# Patient Record
Sex: Male | Born: 1950 | Race: White | Hispanic: No | Smoking: Current every day smoker
Health system: Southern US, Community
[De-identification: ages and names within clinical notes are randomized; demographics above are authoritative.]

## PROBLEM LIST (undated history)

## (undated) DIAGNOSIS — J45909 Unspecified asthma, uncomplicated: Secondary | ICD-10-CM

## (undated) DIAGNOSIS — J449 Chronic obstructive pulmonary disease, unspecified: Secondary | ICD-10-CM

## (undated) HISTORY — PX: JOINT REPLACEMENT: SHX530

---

## 1997-11-03 ENCOUNTER — Inpatient Hospital Stay (HOSPITAL_COMMUNITY): Admission: EM | Admit: 1997-11-03 | Discharge: 1997-11-04 | Payer: Self-pay | Admitting: Emergency Medicine

## 1997-11-11 ENCOUNTER — Ambulatory Visit (HOSPITAL_COMMUNITY): Admission: RE | Admit: 1997-11-11 | Discharge: 1997-11-11 | Payer: Self-pay | Admitting: *Deleted

## 1998-01-06 ENCOUNTER — Encounter: Admission: RE | Admit: 1998-01-06 | Discharge: 1998-01-06 | Payer: Self-pay | Admitting: Internal Medicine

## 1998-02-20 ENCOUNTER — Inpatient Hospital Stay (HOSPITAL_COMMUNITY): Admission: EM | Admit: 1998-02-20 | Discharge: 1998-02-22 | Payer: Self-pay | Admitting: Emergency Medicine

## 2000-07-30 ENCOUNTER — Emergency Department (HOSPITAL_COMMUNITY): Admission: EM | Admit: 2000-07-30 | Discharge: 2000-07-30 | Payer: Self-pay | Admitting: Emergency Medicine

## 2000-07-30 ENCOUNTER — Encounter: Payer: Self-pay | Admitting: Emergency Medicine

## 2001-03-15 ENCOUNTER — Encounter: Payer: Self-pay | Admitting: Emergency Medicine

## 2001-03-15 ENCOUNTER — Inpatient Hospital Stay (HOSPITAL_COMMUNITY): Admission: EM | Admit: 2001-03-15 | Discharge: 2001-03-26 | Payer: Self-pay | Admitting: Emergency Medicine

## 2001-03-16 ENCOUNTER — Encounter: Payer: Self-pay | Admitting: General Surgery

## 2001-03-17 ENCOUNTER — Encounter: Payer: Self-pay | Admitting: General Surgery

## 2001-05-02 ENCOUNTER — Encounter: Admission: RE | Admit: 2001-05-02 | Discharge: 2001-05-09 | Payer: Self-pay | Admitting: Physician Assistant

## 2005-07-25 ENCOUNTER — Emergency Department (HOSPITAL_COMMUNITY): Admission: EM | Admit: 2005-07-25 | Discharge: 2005-07-25 | Payer: Self-pay | Admitting: Emergency Medicine

## 2017-01-09 ENCOUNTER — Observation Stay (HOSPITAL_COMMUNITY): Payer: Medicare Other

## 2017-01-09 ENCOUNTER — Inpatient Hospital Stay (HOSPITAL_COMMUNITY)
Admission: EM | Admit: 2017-01-09 | Discharge: 2017-01-12 | DRG: 146 | Discharge status: Against Medical Advice | Payer: Medicare Other | Attending: Oncology | Admitting: Oncology

## 2017-01-09 ENCOUNTER — Emergency Department (HOSPITAL_COMMUNITY): Payer: Medicare Other

## 2017-01-09 ENCOUNTER — Inpatient Hospital Stay (HOSPITAL_COMMUNITY): Payer: Medicare Other

## 2017-01-09 ENCOUNTER — Encounter (HOSPITAL_COMMUNITY): Payer: Self-pay | Admitting: Neurology

## 2017-01-09 DIAGNOSIS — B370 Candidal stomatitis: Secondary | ICD-10-CM | POA: Diagnosis present

## 2017-01-09 DIAGNOSIS — F17210 Nicotine dependence, cigarettes, uncomplicated: Secondary | ICD-10-CM | POA: Diagnosis present

## 2017-01-09 DIAGNOSIS — C140 Malignant neoplasm of pharynx, unspecified: Secondary | ICD-10-CM | POA: Diagnosis present

## 2017-01-09 DIAGNOSIS — W1830XA Fall on same level, unspecified, initial encounter: Secondary | ICD-10-CM | POA: Diagnosis present

## 2017-01-09 DIAGNOSIS — E861 Hypovolemia: Secondary | ICD-10-CM | POA: Diagnosis present

## 2017-01-09 DIAGNOSIS — E46 Unspecified protein-calorie malnutrition: Secondary | ICD-10-CM | POA: Diagnosis not present

## 2017-01-09 DIAGNOSIS — Z955 Presence of coronary angioplasty implant and graft: Secondary | ICD-10-CM

## 2017-01-09 DIAGNOSIS — R0600 Dyspnea, unspecified: Secondary | ICD-10-CM

## 2017-01-09 DIAGNOSIS — I889 Nonspecific lymphadenitis, unspecified: Secondary | ICD-10-CM | POA: Diagnosis not present

## 2017-01-09 DIAGNOSIS — R1312 Dysphagia, oropharyngeal phase: Secondary | ICD-10-CM | POA: Diagnosis present

## 2017-01-09 DIAGNOSIS — T17998A Other foreign object in respiratory tract, part unspecified causing other injury, initial encounter: Secondary | ICD-10-CM | POA: Diagnosis present

## 2017-01-09 DIAGNOSIS — Z5321 Procedure and treatment not carried out due to patient leaving prior to being seen by health care provider: Secondary | ICD-10-CM

## 2017-01-09 DIAGNOSIS — F1010 Alcohol abuse, uncomplicated: Secondary | ICD-10-CM | POA: Diagnosis present

## 2017-01-09 DIAGNOSIS — J449 Chronic obstructive pulmonary disease, unspecified: Secondary | ICD-10-CM

## 2017-01-09 DIAGNOSIS — C7800 Secondary malignant neoplasm of unspecified lung: Secondary | ICD-10-CM | POA: Diagnosis present

## 2017-01-09 DIAGNOSIS — N390 Urinary tract infection, site not specified: Secondary | ICD-10-CM | POA: Diagnosis present

## 2017-01-09 DIAGNOSIS — Z7982 Long term (current) use of aspirin: Secondary | ICD-10-CM

## 2017-01-09 DIAGNOSIS — S50812A Abrasion of left forearm, initial encounter: Secondary | ICD-10-CM | POA: Diagnosis present

## 2017-01-09 DIAGNOSIS — Z79899 Other long term (current) drug therapy: Secondary | ICD-10-CM

## 2017-01-09 DIAGNOSIS — Z8674 Personal history of sudden cardiac arrest: Secondary | ICD-10-CM

## 2017-01-09 DIAGNOSIS — Z9889 Other specified postprocedural states: Secondary | ICD-10-CM | POA: Diagnosis not present

## 2017-01-09 DIAGNOSIS — Z6821 Body mass index (BMI) 21.0-21.9, adult: Secondary | ICD-10-CM

## 2017-01-09 DIAGNOSIS — R55 Syncope and collapse: Secondary | ICD-10-CM | POA: Diagnosis present

## 2017-01-09 DIAGNOSIS — N179 Acute kidney failure, unspecified: Secondary | ICD-10-CM

## 2017-01-09 DIAGNOSIS — J392 Other diseases of pharynx: Secondary | ICD-10-CM | POA: Insufficient documentation

## 2017-01-09 DIAGNOSIS — I10 Essential (primary) hypertension: Secondary | ICD-10-CM | POA: Diagnosis not present

## 2017-01-09 DIAGNOSIS — E876 Hypokalemia: Secondary | ICD-10-CM | POA: Diagnosis present

## 2017-01-09 DIAGNOSIS — E43 Unspecified severe protein-calorie malnutrition: Secondary | ICD-10-CM | POA: Diagnosis present

## 2017-01-09 DIAGNOSIS — Z7289 Other problems related to lifestyle: Secondary | ICD-10-CM | POA: Diagnosis not present

## 2017-01-09 DIAGNOSIS — I2510 Atherosclerotic heart disease of native coronary artery without angina pectoris: Secondary | ICD-10-CM | POA: Diagnosis present

## 2017-01-09 DIAGNOSIS — I252 Old myocardial infarction: Secondary | ICD-10-CM

## 2017-01-09 DIAGNOSIS — E860 Dehydration: Secondary | ICD-10-CM | POA: Diagnosis present

## 2017-01-09 DIAGNOSIS — I959 Hypotension, unspecified: Secondary | ICD-10-CM | POA: Diagnosis present

## 2017-01-09 DIAGNOSIS — J387 Other diseases of larynx: Secondary | ICD-10-CM | POA: Diagnosis not present

## 2017-01-09 DIAGNOSIS — R1310 Dysphagia, unspecified: Secondary | ICD-10-CM

## 2017-01-09 DIAGNOSIS — C779 Secondary and unspecified malignant neoplasm of lymph node, unspecified: Secondary | ICD-10-CM | POA: Diagnosis present

## 2017-01-09 DIAGNOSIS — R8299 Other abnormal findings in urine: Secondary | ICD-10-CM | POA: Diagnosis not present

## 2017-01-09 DIAGNOSIS — Z8249 Family history of ischemic heart disease and other diseases of the circulatory system: Secondary | ICD-10-CM

## 2017-01-09 DIAGNOSIS — I951 Orthostatic hypotension: Secondary | ICD-10-CM | POA: Diagnosis not present

## 2017-01-09 HISTORY — DX: Chronic obstructive pulmonary disease, unspecified: J44.9

## 2017-01-09 HISTORY — DX: Unspecified asthma, uncomplicated: J45.909

## 2017-01-09 LAB — COMPREHENSIVE METABOLIC PANEL
ALT: 10 U/L — AB (ref 17–63)
AST: 17 U/L (ref 15–41)
Albumin: 2.9 g/dL — ABNORMAL LOW (ref 3.5–5.0)
Alkaline Phosphatase: 57 U/L (ref 38–126)
Anion gap: 14 (ref 5–15)
BUN: 34 mg/dL — ABNORMAL HIGH (ref 6–20)
CHLORIDE: 101 mmol/L (ref 101–111)
CO2: 21 mmol/L — AB (ref 22–32)
CREATININE: 1.76 mg/dL — AB (ref 0.61–1.24)
Calcium: 9.1 mg/dL (ref 8.9–10.3)
GFR calc non Af Amer: 39 mL/min — ABNORMAL LOW (ref >60)
GFR, EST AFRICAN AMERICAN: 45 mL/min — AB (ref >60)
Glucose, Bld: 98 mg/dL (ref 65–99)
Potassium: 3 mmol/L — ABNORMAL LOW (ref 3.5–5.1)
SODIUM: 136 mmol/L (ref 135–145)
Total Bilirubin: 1.1 mg/dL (ref 0.3–1.2)
Total Protein: 5.9 g/dL — ABNORMAL LOW (ref 6.5–8.1)

## 2017-01-09 LAB — MAGNESIUM: MAGNESIUM: 1.9 mg/dL (ref 1.7–2.4)

## 2017-01-09 LAB — CBC WITH DIFFERENTIAL/PLATELET
BASOS ABS: 0 10*3/uL (ref 0.0–0.1)
Basophils Relative: 0 %
EOS ABS: 0 10*3/uL (ref 0.0–0.7)
Eosinophils Relative: 1 %
HCT: 40.4 % (ref 39.0–52.0)
HEMOGLOBIN: 13.7 g/dL (ref 13.0–17.0)
LYMPHS ABS: 0.7 10*3/uL (ref 0.7–4.0)
Lymphocytes Relative: 9 %
MCH: 29.1 pg (ref 26.0–34.0)
MCHC: 33.9 g/dL (ref 30.0–36.0)
MCV: 85.8 fL (ref 78.0–100.0)
Monocytes Absolute: 0.5 10*3/uL (ref 0.1–1.0)
Monocytes Relative: 6 %
NEUTROS PCT: 84 %
Neutro Abs: 6.4 10*3/uL (ref 1.7–7.7)
Platelets: 153 10*3/uL (ref 150–400)
RBC: 4.71 MIL/uL (ref 4.22–5.81)
RDW: 15.9 % — ABNORMAL HIGH (ref 11.5–15.5)
WBC: 7.6 10*3/uL (ref 4.0–10.5)

## 2017-01-09 LAB — TROPONIN I

## 2017-01-09 LAB — I-STAT TROPONIN, ED: TROPONIN I, POC: 0 ng/mL (ref 0.00–0.08)

## 2017-01-09 LAB — ETHANOL: Alcohol, Ethyl (B): <5 mg/dL (ref <5)

## 2017-01-09 LAB — URINALYSIS, ROUTINE W REFLEX MICROSCOPIC
Bilirubin Urine: NEGATIVE
Glucose, UA: NEGATIVE mg/dL
Ketones, ur: 20 mg/dL — AB
Nitrite: NEGATIVE
PH: 5 (ref 5.0–8.0)
Protein, ur: NEGATIVE mg/dL
SPECIFIC GRAVITY, URINE: 1.024 (ref 1.005–1.030)

## 2017-01-09 LAB — I-STAT CHEM 8, ED
BUN: 36 mg/dL — ABNORMAL HIGH (ref 6–20)
CREATININE: 1.4 mg/dL — AB (ref 0.61–1.24)
Calcium, Ion: 1.09 mmol/L — ABNORMAL LOW (ref 1.15–1.40)
Chloride: 101 mmol/L (ref 101–111)
Glucose, Bld: 93 mg/dL (ref 65–99)
HEMATOCRIT: 40 % (ref 39.0–52.0)
HEMOGLOBIN: 13.6 g/dL (ref 13.0–17.0)
POTASSIUM: 3.1 mmol/L — AB (ref 3.5–5.1)
SODIUM: 137 mmol/L (ref 135–145)
TCO2: 24 mmol/L (ref 0–100)

## 2017-01-09 LAB — RAPID URINE DRUG SCREEN, HOSP PERFORMED
Amphetamines: NOT DETECTED
BARBITURATES: NOT DETECTED
BENZODIAZEPINES: NOT DETECTED
COCAINE: NOT DETECTED
OPIATES: NOT DETECTED
TETRAHYDROCANNABINOL: NOT DETECTED

## 2017-01-09 LAB — CBG MONITORING, ED: Glucose-Capillary: 86 mg/dL (ref 65–99)

## 2017-01-09 LAB — PROTIME-INR
INR: 1.19
PROTHROMBIN TIME: 15.2 s (ref 11.4–15.2)

## 2017-01-09 LAB — I-STAT VENOUS BLOOD GAS, ED
Acid-Base Excess: 3 mmol/L — ABNORMAL HIGH (ref 0.0–2.0)
BICARBONATE: 25.7 mmol/L (ref 20.0–28.0)
O2 Saturation: 27 %
PCO2 VEN: 33.9 mmHg — AB (ref 44.0–60.0)
TCO2: 27 mmol/L (ref 0–100)
pH, Ven: 7.489 — ABNORMAL HIGH (ref 7.250–7.430)
pO2, Ven: 16 mmHg — CL (ref 32.0–45.0)

## 2017-01-09 LAB — I-STAT CG4 LACTIC ACID, ED: LACTIC ACID, VENOUS: 1.97 mmol/L — AB (ref 0.5–1.9)

## 2017-01-09 LAB — CK: Total CK: 35 U/L — ABNORMAL LOW (ref 49–397)

## 2017-01-09 LAB — SALICYLATE LEVEL: SALICYLATE LVL: 14.9 mg/dL (ref 2.8–30.0)

## 2017-01-09 LAB — ACETAMINOPHEN LEVEL: Acetaminophen (Tylenol), Serum: <10 ug/mL — ABNORMAL LOW (ref 10–30)

## 2017-01-09 MED ORDER — POTASSIUM CHLORIDE CRYS ER 20 MEQ PO TBCR
40.0000 meq | EXTENDED_RELEASE_TABLET | Freq: Once | ORAL | Status: AC
  Administered 2017-01-09: 40 meq via ORAL
  Filled 2017-01-09: qty 2

## 2017-01-09 MED ORDER — ALBUTEROL SULFATE (2.5 MG/3ML) 0.083% IN NEBU: 2.5000 mg | INHALATION_SOLUTION | RESPIRATORY_TRACT | Status: DC | PRN

## 2017-01-09 MED ORDER — POTASSIUM CHLORIDE IN NACL 40-0.9 MEQ/L-% IV SOLN: INTRAVENOUS | Status: DC

## 2017-01-09 MED ORDER — SODIUM CHLORIDE 0.9 % IV BOLUS (SEPSIS)
1000.0000 mL | Freq: Once | INTRAVENOUS | Status: AC
  Administered 2017-01-09: 1000 mL via INTRAVENOUS

## 2017-01-09 MED ORDER — ADULT MULTIVITAMIN W/MINERALS CH: 1.0000 | ORAL_TABLET | Freq: Every day | ORAL | Status: DC

## 2017-01-09 MED ORDER — THIAMINE HCL 100 MG/ML IJ SOLN
100.0000 mg | Freq: Every day | INTRAMUSCULAR | Status: DC
  Administered 2017-01-10 – 2017-01-12 (×2): 100 mg via INTRAVENOUS
  Filled 2017-01-09 (×2): qty 2

## 2017-01-09 MED ORDER — DEXTROSE 5 % IV SOLN
1.0000 g | Freq: Once | INTRAVENOUS | Status: AC
  Administered 2017-01-09: 1 g via INTRAVENOUS
  Filled 2017-01-09: qty 10

## 2017-01-09 MED ORDER — HYDROMORPHONE HCL 1 MG/ML IJ SOLN: 1.0000 mg | Freq: Once | INTRAMUSCULAR | Status: DC

## 2017-01-09 MED ORDER — ENOXAPARIN SODIUM 40 MG/0.4ML ~~LOC~~ SOLN
40.0000 mg | SUBCUTANEOUS | Status: DC
  Administered 2017-01-10 – 2017-01-12 (×2): 40 mg via SUBCUTANEOUS
  Filled 2017-01-09 (×2): qty 0.4

## 2017-01-09 MED ORDER — ASPIRIN EC 81 MG PO TBEC: 81.0000 mg | DELAYED_RELEASE_TABLET | Freq: Every day | ORAL | Status: DC

## 2017-01-09 MED ORDER — FOLIC ACID 1 MG PO TABS: 1.0000 mg | ORAL_TABLET | Freq: Every day | ORAL | Status: DC

## 2017-01-09 MED ORDER — IPRATROPIUM-ALBUTEROL 0.5-2.5 (3) MG/3ML IN SOLN: 3.0000 mL | Freq: Four times a day (QID) | RESPIRATORY_TRACT | Status: DC | PRN

## 2017-01-09 MED ORDER — NYSTATIN 100000 UNIT/ML MT SUSP: 5.0000 mL | Freq: Four times a day (QID) | OROMUCOSAL | Status: DC

## 2017-01-09 MED ORDER — LORAZEPAM 2 MG/ML IJ SOLN
1.0000 mg | Freq: Four times a day (QID) | INTRAMUSCULAR | Status: DC | PRN
  Administered 2017-01-10 (×2): 1 mg via INTRAVENOUS
  Filled 2017-01-09 (×2): qty 1

## 2017-01-09 MED ORDER — FLUCONAZOLE IN SODIUM CHLORIDE 100-0.9 MG/50ML-% IV SOLN
100.0000 mg | INTRAVENOUS | Status: DC
  Administered 2017-01-09 – 2017-01-10 (×2): 100 mg via INTRAVENOUS
  Filled 2017-01-09 (×2): qty 50

## 2017-01-09 MED ORDER — POTASSIUM CHLORIDE 20 MEQ/15ML (10%) PO SOLN
20.0000 meq | Freq: Once | ORAL | Status: AC
  Administered 2017-01-09: 20 meq via ORAL
  Filled 2017-01-09: qty 15

## 2017-01-09 MED ORDER — IPRATROPIUM-ALBUTEROL 0.5-2.5 (3) MG/3ML IN SOLN
3.0000 mL | Freq: Once | RESPIRATORY_TRACT | Status: AC
  Administered 2017-01-09: 3 mL via RESPIRATORY_TRACT
  Filled 2017-01-09: qty 3

## 2017-01-09 MED ORDER — SODIUM CHLORIDE 0.9% FLUSH
3.0000 mL | Freq: Two times a day (BID) | INTRAVENOUS | Status: DC
  Administered 2017-01-10: 3 mL via INTRAVENOUS

## 2017-01-09 MED ORDER — ATORVASTATIN CALCIUM 40 MG PO TABS: 40.0000 mg | ORAL_TABLET | Freq: Every day | ORAL | Status: DC

## 2017-01-09 MED ORDER — SODIUM CHLORIDE 0.9 % IV BOLUS (SEPSIS)
1000.0000 mL | Freq: Once | INTRAVENOUS | Status: AC
Start: 2017-01-09 — End: 2017-01-09
  Administered 2017-01-09: 1000 mL via INTRAVENOUS

## 2017-01-09 MED ORDER — LORAZEPAM 1 MG PO TABS: 1.0000 mg | ORAL_TABLET | Freq: Four times a day (QID) | ORAL | Status: DC | PRN

## 2017-01-09 MED ORDER — POTASSIUM CHLORIDE IN NACL 40-0.9 MEQ/L-% IV SOLN
INTRAVENOUS | Status: DC
  Administered 2017-01-09: 125 mL/h via INTRAVENOUS
  Filled 2017-01-09: qty 1000

## 2017-01-09 MED ORDER — VITAMIN B-1 100 MG PO TABS: 100.0000 mg | ORAL_TABLET | Freq: Every day | ORAL | Status: DC

## 2017-01-09 NOTE — ED Notes (Signed)
Spoke with Dr Posey Pronto re: pt BP per Dr Henderson Cloud to go to step down awaiting orders for additional fluid bolus and maintenance fluids.

## 2017-01-09 NOTE — ED Notes (Signed)
IM aware of low bp.

## 2017-01-09 NOTE — ED Notes (Signed)
Attempted report x1 to SDU

## 2017-01-09 NOTE — ED Triage Notes (Signed)
Per ems- Pt comes from home. C/o generalized weakness, hypotension since today. Reports syncopal episode today causing him to fall. Skin tear to left forearm from ground level fall. C/o h/a before fall, 10/10. Initial BP 83/31, given 1 l NS, 111/80 recent BP. 96% RA. Generalized weakness, equal grips. CBG 117. History: asthma, COPD.

## 2017-01-09 NOTE — ED Notes (Signed)
Attempted report x1. 

## 2017-01-09 NOTE — ED Notes (Signed)
Patient transported to X-ray 

## 2017-01-09 NOTE — ED Notes (Addendum)
Patient in acute respiratory distress. Sitting in tripod position at the end of the bed. Patient coughing up sputum. Patient states he cannot breath but able to talk in short sentences. EDP at bedside. Patient placed on NR and given suction. Patient improved. cxray ordered and douneb ordered. IM MD updated and MD to come to see patient.

## 2017-01-09 NOTE — Progress Notes (Signed)
Pt sleeping when RT arrived to teach use of Flutter valve. No obvious respiratory distress noted at this time. SpO2 100% on RA. Flutter left at bedside. RT Will check back later.

## 2017-01-09 NOTE — ED Notes (Signed)
Manual bp 74/48. MD aware. Patient sleeping and has no c/o.

## 2017-01-09 NOTE — ED Notes (Signed)
Patient attempted to swallow second K+ pill. This RN saw patient began to choke on K+ pill as he was in distress. This RN asked patient, "are you choking?" Patient nodded. Heimlich Maneuver done x4, patient still gasping for air, Dr. Regenia Skeeter arrived and performed heimlich maneuver x2. K+ pill was dislodged from throat. MD ordered oral K+ solution. Patient stating he is okay.

## 2017-01-09 NOTE — H&P (Signed)
Date: 01/09/2017               Patient Name:  Jermaine Scott MRN: [id number]  DOB: 06/21/1951 Age / Sex: 66 y.o., male   PCP: Patient, No Pcp Per              Medical Service: Internal Medicine Teaching Service              Attending Physician: Dr. Beryle Beams, Alyson Locket, MD    First Contact: Marylen Ponto, MS4 Pager: [phone]  Second Contact: Dr. Posey Pronto Pager: [phone]            After Hours (After 5p/  First Contact Pager: [phone]  weekends / holidays): Second Contact Pager: [phone]   Chief Complaint: syncope  History of Present Illness:  Jermaine Scott is a 66 yo man with history of COPD, HTN, CAD s/p PCI who presents after a near-syncopal episode this morning.  He states that he woke up this morning with a mild headache.  Around 30 minutes after waking up, he went to sit at the kitchen table to set up his plate but had not yet had anything to eat or drink that morning.  He then stood up and started to walk down the hallway and started to feel dizzy and fell to the ground without LOC.  He does not recall any preceding symptoms including CP, palpitations, increasing HA, vision changes / tunnel vision.  He did not have any prolonged weakness or loss of bladder or bowel incontinence after the fall.  He does state that he does not think he has been drinking as well as he should be given the heat, but thinks he had a good meal and water before going to bed.  Additionally, he has not had any more generalized fever, fatigue, weakness, dysuria, or increased urinary frequency.    He does have an additional complaint of 3-4 days of sore throat.  In the end of May, he had a similar presentation at [facility].  At that time, he was treated for dehydration with pre-renal AKI and COPD exacerbation.  He does not take any medications at this time other than aspirin.  He says he drinks around 12 beers a week and says he does not have a history of alcohol withdrawal.    He was given fluid with EMS prior to arrival in  the ED with resolution of some of his dizziness and HA.  On rechek in the ED, his bp was ~60/40 and he was given a 2L bolus with improvement of pressures.  POCUS was negative for pericardial effusion and AAA. CT head was negative and he was given one dose of ceftriaxone for possible UTI.  He choked on his potassium supplement in the ED and required heimlich maneuver.   After initial history, he had an episode of hypotension and SOB.  He was coughing up clear sputum and increased HA.  He was given duoneb treatment with some improvement.  The was also given another liter of NS at this time with improvement in bp.    Meds: Current Meds  Medication Sig  . aspirin EC 325 MG tablet Take 325 mg by mouth every morning.  Marland Kitchen atorvastatin (LIPITOR) 40 MG tablet Take 40 mg by mouth at bedtime.     Allergies: Allergies as of 01/09/2017  . (No Known Allergies)   Past Medical History:  Diagnosis Date  . Asthma   . COPD (chronic obstructive pulmonary disease) ([place])     Family History:  Significant only for MI in father at ~66.  Social History:  He lives by himself and likes to watch TV and likes to walk in the woods where he collects materials to make crafts from.    He says he has been smoking 1ppd for ~50 years. He also uses around 12 beers / week, but does not have any history of withdrawal. He denies any illicit or non-prescription drug use.    Review of Systems: Constitutional: Denies fever, fatigue, chills, night sweats, unintentional weight change HEENT: No sinus pain. No change in vision or hearing.  CV:  Denies chest pain, palpitations, orthopnea Resp: No cough, shortness of breath GI: Denies abdominal pain, nausea, vomiting, diarrhea.  No constipation, blood in stool, dysphagia MSK: Denies arthralgias, myalgias, joint pain Urology: Denies dysuria, hematuria, change in urinary frequency, change in urinary urgency Skin: Denies new rash, new worrisome lesions Neurology: Denies  headache, numbness Psychology:  Denies depressed mood, agitation, sleep problems   Physical Exam: Blood pressure (!) 79/60, pulse 65, temperature 97.3 F (36.3 C), temperature source Rectal, resp. rate (!) 24, height 5\' 10"  (1.778 m), weight 68 kg (150 lb), SpO2 99 %. Gen: Malnourished man who appears older than stated age in NAD. Neuro:  Alert and oriented x3. Strength 5/5 bilaterally in upper and lower extremities.  DTRs 2+.   HEENT:  Head: NCAT.  Eyes: EOMI. PERRLA.  Mouth:  White plaques covering tongue and posterior pharynx.  Moist mucous membranes.  CV: RRR with no murmurs, rubs, or gallops. Pulm:  Diffuse expiratory wheezes and vibrations.  Back:  Firm, mobile mass on left upper back. No CVA tenderness.  Abdomen: Soft, non-tender to palpation in four quadrants.  Normal BS appreciated.  Extremities:  No LE edema.  Pulses in all extremities intact. Skin: Ecchymosis on upper extremities. No hyperpigmentation.    Assessment & Plan by Problem: Principal Problem:   Syncope Active Problems:   COPD (chronic obstructive pulmonary disease) (HCC)   Hypotension   AKI (acute kidney injury) ([place])   Oral candidiasis  Hypotension and Near syncope: Presentation likely secondary to dehydration or orthostatic hypotension given poor input per patient and similar presentation in around 1 month ago.  This seems to be fluid responsive and symptoms of headache and dizziness improved with IVF.  He is s/p 3L in the ED.  However, we would expect more of a sustained improvement after fluid boluses.  Other etiologies of syncope including neurogenic are less likely given history without post-ictal period or incontinence and blood glucose in the ED was normal.  He also does not give [place] a clear cardiac history without CP or palpitations, but would entertain this as an etiology given no prodrome to symptoms and recurrent hypotension.  We have additionally considered AI related to his hypotension, but he does  not have generalized weakness / fatigue, electrolyte abnormalities, and recent morning cortisol in May wnl.   - admit to stepdown  -  telemetry monitoring - nutrition consult given appears malnourished - ECHO - orthostatic bps - PT / OT evals  Dyspnea: Likely related to his underlying COPD. However, he is not sure if duoneb improved his symptoms.  He does not take any medications at this time for COPD, but does have a diagnosis in his chart per care everywhere with PRN albuterol prescription.  He was treated for a COPD exacerbation ~1 month ago at [facility] with a similar presentation.  He has gotten 3L IVF with hypotension so pulmonary edema was considered but  CXR only shows hyperinflation. There is a also concern for aspiration given episode in the ED and new onset SOB.  - NPO possible given aspiration risk - duoneb q6 PRN for shortness of breath - flutter valve PRN   AKI:   Likely pre-renal given presentation with hypotension.  Serum Cr today of 1.76 with baseline around 0.8 in 2017.  BUN/Cr of ~20. Should improve now that he is s/p 3L NS.   - IVF at 125 cc/hr - will recheck in BMP AM  Alcohol use disorder: Uses around 12 beers a week and does not have a history of withdrawal.  Last drink yesterday evening and alcohol level undetectable in the ED today.   - CIWA protocol  - thiamine and folate supplementation  Oral candidiasis: Gives a 3-4 day history of sore throat with thrush on exam.  - IV fluconazole 100 mg  - given some fatigue picture with possible esophageal candidiasis, will check HIV  Small leukocytes on UA: Will not continue treatment at this time as he is asymptomatic.  S/p one dose of ceftriaxone in the ED.   FEN: IVF at 125 ml/ hr ; Replete electrolytes as needed VTE PPX: enoxaparin CODE:  Full  Signed: Doug Sou, Medical Student 01/09/2017, 7:48 PM   Attestation for Student Documentation:  I personally was present and performed or re-performed the history,  physical exam and medical decision-making activities of this service and have verified that the service and findings are accurately documented in the student's note.  Zada Finders, MD 01/09/2017, 8:07 PM

## 2017-01-09 NOTE — ED Provider Notes (Signed)
[facility] DEPT Provider Note   CSN: [id number] Arrival date & time: 01/09/17  1324     History   Chief Complaint Chief Complaint  Patient presents with  . Weakness    HPI Jermaine Scott is a 66 y.o. male.  HPI  8 rolled male presents with dizziness, syncope, and headache. He states it's a frontal headache although it has been bitemporal at times as well. The patient states that the dizziness has been worse today but started at least yesterday. He states he feels like he is about to pass out/lightheadedness. Today while he was walking the dizziness got worse and he did pass out. No chest pain or shortness of breath. He states he is always a little short of breath from COPD but it has not worsened. He states he drinks 1 glass of wine or a couple beers per night but then states he does not drink every day. No abdominal pain, nausea, vomiting, diarrhea. No urinary symptoms. He states the headache has been gradual in onset and did not worsen before or after the passing out. He did suffer a small abrasion to his left forearm. He was given IV fluids by EMS due to hypotension and states his dizziness was a little bit better after that.  Past Medical History:  Diagnosis Date  . Asthma   . COPD (chronic obstructive pulmonary disease) (HCC)     There are no active problems to display for this patient.   Past Surgical History:  Procedure Laterality Date  . JOINT REPLACEMENT         Home Medications    Prior to Admission medications   Not on File    Family History No family history on file.  Social History Social History  Substance Use Topics  . Smoking status: Current Every Day Smoker  . Smokeless tobacco: Not on file  . Alcohol use Yes     Allergies   Patient has no known allergies.   Review of Systems Review of Systems  Constitutional: Negative for fever.  Respiratory: Negative for cough and shortness of breath.   Cardiovascular: Negative for chest pain.    Gastrointestinal: Negative for abdominal pain and vomiting.  Neurological: Positive for dizziness, syncope, weakness (generalized) and headaches.  All other systems reviewed and are negative.    Physical Exam Updated Vital Signs BP (!) 81/63   Pulse (!) 55   Temp 97.3 F (36.3 C) (Rectal)   Resp 18   Ht 5\' 10"  (1.778 m)   Wt 68 kg (150 lb)   SpO2 100%   BMI 21.52 kg/m   Physical Exam  Constitutional: He is oriented to person, place, and time. He appears well-developed and well-nourished. No distress.  HENT:  Head: Normocephalic and atraumatic.  Right Ear: External ear normal.  Left Ear: External ear normal.  Nose: Nose normal.  Eyes: EOM are normal. Pupils are equal, round, and reactive to light. Right eye exhibits no discharge. Left eye exhibits no discharge.  Neck: Neck supple.  Cardiovascular: Regular rhythm and normal heart sounds.  Bradycardia present.   Pulses:      Radial pulses are 1+ on the right side, and 1+ on the left side.  Pulmonary/Chest: Effort normal and breath sounds normal.  Abdominal: Soft. He exhibits no distension. There is no tenderness.  Musculoskeletal: He exhibits no edema.  Small skin tear to left forearm  Neurological: He is alert and oriented to person, place, and time.  CN 3-12 grossly intact. 5/5 strength in  all 4 extremities. Grossly normal sensation.  Skin: Skin is warm and dry. He is not diaphoretic.  Nursing note and vitals reviewed.    ED Treatments / Results  Labs (all labs ordered are listed, but only abnormal results are displayed) Labs Reviewed  CBC WITH DIFFERENTIAL/PLATELET - Abnormal; Notable for the following:       Result Value   RDW 15.9 (*)    All other components within normal limits  I-STAT CHEM 8, ED - Abnormal; Notable for the following:    Potassium 3.1 (*)    BUN 36 (*)    Creatinine, Ser 1.40 (*)    Calcium, Ion 1.09 (*)    All other components within normal limits  I-STAT CG4 LACTIC ACID, ED - Abnormal;  Notable for the following:    Lactic Acid, Venous 1.97 (*)    All other components within normal limits  I-STAT VENOUS BLOOD GAS, ED - Abnormal; Notable for the following:    pH, Ven 7.489 (*)    pCO2, Ven 33.9 (*)    pO2, Ven 16.0 (*)    Acid-Base Excess 3.0 (*)    All other components within normal limits  PROTIME-INR  COMPREHENSIVE METABOLIC PANEL  ETHANOL  TROPONIN I  ACETAMINOPHEN LEVEL  SALICYLATE LEVEL  URINALYSIS, ROUTINE W REFLEX MICROSCOPIC  RAPID URINE DRUG SCREEN, HOSP PERFORMED  I-STAT TROPOININ, ED  CBG MONITORING, ED    EKG  EKG Interpretation  Date/Time:  Tuesday January 09 2017 13:55:21 EDT Ventricular Rate:  45 PR Interval:    QRS Duration: 98 QT Interval:  452 QTC Calculation: 391 R Axis:   -92 Text Interpretation:  Sinus bradycardia Left anterior fascicular block Probable anteroseptal infarct, old Nonspecific T abnormalities, lateral leads Interpretation limited secondary to artifact Confirmed by Sherwood Gambler [phone]) on 01/09/2017 2:27:10 PM       Radiology Dg Chest Portable 1 View  Result Date: 01/09/2017 CLINICAL DATA:  Low blood pressure, weakness, smoking history EXAM: PORTABLE CHEST 1 VIEW COMPARISON:  Chest x-ray of 12/11/2016 FINDINGS: The lungs are clear but hyperaerated. Mediastinal and hilar contours are stable and unremarkable. The heart is within normal limits in size. Nipple shadows remain at the lung bases. No bony abnormality is seen with an old healed right midclavicular fracture again noted. IMPRESSION: Hyperaeration.  No active lung disease. Electronically Signed   By: Ivar Drape M.D.   On: 01/09/2017 14:13    Procedures Procedures (including critical care time) CRITICAL CARE Performed by: Sherwood Gambler T   Total critical care time: 35 minutes  Critical care time was exclusive of separately billable procedures and treating other patients.  Critical care was necessary to treat or prevent imminent or life-threatening  deterioration.  Critical care was time spent personally by me on the following activities: development of treatment plan with patient and/or surrogate as well as nursing, discussions with consultants, evaluation of patient's response to treatment, examination of patient, obtaining history from patient or surrogate, ordering and performing treatments and interventions, ordering and review of laboratory studies, ordering and review of radiographic studies, pulse oximetry and re-evaluation of patient's condition.   Heimlich Maneuver Procedure Note: Procedure was emergently performed due to acute and severe nature of his choking Hands were placed over epigastrum from behind patient and procedure was performed multiple times, eventually resulting in removal of the potassium pill that caused choking Patient had complete recover, no obvious complications noted. Medications Ordered in ED Medications  sodium chloride 0.9 % bolus 1,000 mL (1,000 mLs  Intravenous New Bag/Given 01/09/17 1357)  sodium chloride 0.9 % bolus 1,000 mL (1,000 mLs Intravenous New Bag/Given 01/09/17 1357)     Initial Impression / Assessment and Plan / ED Course  I have reviewed the triage vital signs and the nursing notes.  Pertinent labs & imaging results that were available during my care of the patient were reviewed by me and considered in my medical decision making (see chart for details).     On chart review, patient had a very similar episode where he was admitted to an outside hospital at the end of May. Had similar dizziness, acute renal failure, and hypotension. He also had a headache at that time. Given what seems to be chronic alcohol abuse and a syncopal episode, CT head was obtained and is negative. He denies urinary symptoms well with small leukocytes in the setting of significant hypotension, he will be treated for a UTI and urine sent for culture. His baseline creatinine appears to be 1 or less according to outside  records. His blood pressure has improved. On arrival I did use a bedside ultrasound and could not find an obvious source of hypotension such as pericardial effusion or AAA. He also has no localizing findings. No signs of a significant infection on review of systems.   While in the ED he was having his potassium replaced orally and started choking on one of the potassium pills. The Heimlich maneuver was performed by myself and this pill was removed. Later he seemed to have trouble with the liquid form and so he probably needs a speech evaluation as well.  Final Clinical Impressions(s) / ED Diagnoses   Final diagnoses:  AKI (acute kidney injury) ([place])  Hypotension, unspecified hypotension type    New Prescriptions New Prescriptions   No medications on file     Sherwood Gambler, MD 01/09/17 1644

## 2017-01-09 NOTE — ED Provider Notes (Signed)
Blood pressure 110/77, pulse 62, temperature 97.3 F (36.3 C), temperature source Rectal, resp. rate 13, height 5\' 10"  (1.778 m), weight 68 kg (150 lb), SpO2 100 %.   In short, Jermaine Scott is a 66 y.o. male with a chief complaint of Weakness .  Refer to the original H&P for additional details.  06:20 PM Called to the patient bedside with acute respiratory distress and hypotension. The patient is sitting upright with labored inspiratory breathing and rales on exam. Oxygen saturation in the 60s. He is awake and alert but is having some difficulty with breathing. In review of the prior providers note car the Heimlich maneuver after choking on a potassium pill. He was not given any additional pills since that time. He was admitted to the internal medicine teaching service and is awaiting transport upstairs. When I walk in the room he is spitting some secretions but not vomiting. We're able to give him a nonrebreather and assist him and sitting upright in clearing his secretions. He seems to be feeling better and remains awake and alert. He is able to use the [place] on himself. Plan for repeat portable chest x-ray to evaluate for possible pulmonary edema but have lower suspicion for this given that the patient is actually hypotensive. We will also give DuoNeb given his history of COPD. BiPAP was brought to the bedside but elected not to use this because the patient seemed to be improving and is still actively attempting to clear secretions. I asked the nursing call the internal medicine teaching service for reevaluation.   06:45 PM Patient looks comfortable and breathing normally off oxygen. Suspect that he may have become choked on a mucous plug. CXR pending.   07:40 PM Patient continues to look well but remains slightly hypotensive. He is awake and alert. Repeat chest x-ray shows no acute abnormality. No BiPAP.   Nanda Quinton, MD   Margette Fast, MD 01/09/17 [id number]

## 2017-01-09 NOTE — ED Notes (Signed)
Post void is 75mL

## 2017-01-10 ENCOUNTER — Inpatient Hospital Stay (HOSPITAL_COMMUNITY): Payer: Medicare Other

## 2017-01-10 ENCOUNTER — Encounter (HOSPITAL_COMMUNITY): Payer: Self-pay | Admitting: *Deleted

## 2017-01-10 DIAGNOSIS — Z8249 Family history of ischemic heart disease and other diseases of the circulatory system: Secondary | ICD-10-CM

## 2017-01-10 DIAGNOSIS — R8299 Other abnormal findings in urine: Secondary | ICD-10-CM

## 2017-01-10 DIAGNOSIS — X58XXXA Exposure to other specified factors, initial encounter: Secondary | ICD-10-CM

## 2017-01-10 DIAGNOSIS — F17210 Nicotine dependence, cigarettes, uncomplicated: Secondary | ICD-10-CM

## 2017-01-10 DIAGNOSIS — Y92238 Other place in hospital as the place of occurrence of the external cause: Secondary | ICD-10-CM

## 2017-01-10 DIAGNOSIS — B370 Candidal stomatitis: Secondary | ICD-10-CM

## 2017-01-10 DIAGNOSIS — R0600 Dyspnea, unspecified: Secondary | ICD-10-CM

## 2017-01-10 DIAGNOSIS — I10 Essential (primary) hypertension: Secondary | ICD-10-CM

## 2017-01-10 DIAGNOSIS — E46 Unspecified protein-calorie malnutrition: Secondary | ICD-10-CM

## 2017-01-10 DIAGNOSIS — Z8674 Personal history of sudden cardiac arrest: Secondary | ICD-10-CM

## 2017-01-10 DIAGNOSIS — Z955 Presence of coronary angioplasty implant and graft: Secondary | ICD-10-CM

## 2017-01-10 DIAGNOSIS — I2510 Atherosclerotic heart disease of native coronary artery without angina pectoris: Secondary | ICD-10-CM

## 2017-01-10 DIAGNOSIS — T17998A Other foreign object in respiratory tract, part unspecified causing other injury, initial encounter: Secondary | ICD-10-CM

## 2017-01-10 DIAGNOSIS — N179 Acute kidney failure, unspecified: Secondary | ICD-10-CM

## 2017-01-10 DIAGNOSIS — I951 Orthostatic hypotension: Secondary | ICD-10-CM

## 2017-01-10 DIAGNOSIS — I252 Old myocardial infarction: Secondary | ICD-10-CM

## 2017-01-10 DIAGNOSIS — Z7289 Other problems related to lifestyle: Secondary | ICD-10-CM

## 2017-01-10 DIAGNOSIS — R55 Syncope and collapse: Secondary | ICD-10-CM

## 2017-01-10 DIAGNOSIS — J449 Chronic obstructive pulmonary disease, unspecified: Secondary | ICD-10-CM

## 2017-01-10 DIAGNOSIS — R1310 Dysphagia, unspecified: Secondary | ICD-10-CM

## 2017-01-10 DIAGNOSIS — E860 Dehydration: Secondary | ICD-10-CM

## 2017-01-10 LAB — GLUCOSE, CAPILLARY: Glucose-Capillary: 70 mg/dL (ref 65–99)

## 2017-01-10 LAB — CBC
HCT: 37.1 % — ABNORMAL LOW (ref 39.0–52.0)
Hemoglobin: 12.3 g/dL — ABNORMAL LOW (ref 13.0–17.0)
MCH: 28.7 pg (ref 26.0–34.0)
MCHC: 33.2 g/dL (ref 30.0–36.0)
MCV: 86.7 fL (ref 78.0–100.0)
PLATELETS: 122 10*3/uL — AB (ref 150–400)
RBC: 4.28 MIL/uL (ref 4.22–5.81)
RDW: 16.4 % — ABNORMAL HIGH (ref 11.5–15.5)
WBC: 5.4 10*3/uL (ref 4.0–10.5)

## 2017-01-10 LAB — URINE CULTURE: CULTURE: NO GROWTH

## 2017-01-10 LAB — MRSA PCR SCREENING: MRSA by PCR: NEGATIVE

## 2017-01-10 LAB — ACTH STIMULATION, 3 TIME POINTS
Cortisol, 30 Min: 28.8 ug/dL
Cortisol, 60 Min: 37.4 ug/dL
Cortisol, Base: 18.9 ug/dL

## 2017-01-10 LAB — BASIC METABOLIC PANEL
Anion gap: 7 (ref 5–15)
BUN: 23 mg/dL — AB (ref 6–20)
CALCIUM: 8 mg/dL — AB (ref 8.9–10.3)
CO2: 21 mmol/L — AB (ref 22–32)
CREATININE: 1.12 mg/dL (ref 0.61–1.24)
Chloride: 114 mmol/L — ABNORMAL HIGH (ref 101–111)
GFR calc Af Amer: >60 mL/min (ref >60)
GFR calc non Af Amer: >60 mL/min (ref >60)
GLUCOSE: 67 mg/dL (ref 65–99)
Potassium: 3.3 mmol/L — ABNORMAL LOW (ref 3.5–5.1)
Sodium: 142 mmol/L (ref 135–145)

## 2017-01-10 LAB — VAS US CAROTID
LCCADDIAS: -13 cm/s
LEFT ECA DIAS: -13 cm/s
LEFT VERTEBRAL DIAS: 13 cm/s
LICADDIAS: -35 cm/s
LICADSYS: -105 cm/s
LICAPDIAS: -16 cm/s
LICAPSYS: -49 cm/s
Left CCA dist sys: -41 cm/s
Left CCA prox dias: 12 cm/s
Left CCA prox sys: 37 cm/s
RCCAPSYS: 37 cm/s
RIGHT VERTEBRAL DIAS: -8 cm/s
Right CCA prox dias: 5 cm/s
Right cca dist sys: -81 cm/s

## 2017-01-10 LAB — LACTIC ACID, PLASMA: LACTIC ACID, VENOUS: 0.8 mmol/L (ref 0.5–1.9)

## 2017-01-10 LAB — HIV ANTIBODY (ROUTINE TESTING W REFLEX): HIV SCREEN 4TH GENERATION: NONREACTIVE

## 2017-01-10 LAB — TROPONIN I: Troponin I: 0.03 ng/mL (ref <0.03)

## 2017-01-10 MED ORDER — SODIUM CHLORIDE 0.9 % IV BOLUS (SEPSIS)
1000.0000 mL | Freq: Once | INTRAVENOUS | Status: AC
  Administered 2017-01-10: 1000 mL via INTRAVENOUS

## 2017-01-10 MED ORDER — KETOROLAC TROMETHAMINE 15 MG/ML IJ SOLN
15.0000 mg | Freq: Three times a day (TID) | INTRAMUSCULAR | Status: DC | PRN
  Administered 2017-01-10 – 2017-01-12 (×3): 15 mg via INTRAVENOUS
  Filled 2017-01-10 (×3): qty 1

## 2017-01-10 MED ORDER — POTASSIUM CHLORIDE 10 MEQ/100ML IV SOLN
10.0000 meq | INTRAVENOUS | Status: DC
  Administered 2017-01-10 (×2): 10 meq via INTRAVENOUS
  Filled 2017-01-10 (×4): qty 100

## 2017-01-10 MED ORDER — COSYNTROPIN 0.25 MG IJ SOLR
0.5000 mg | Freq: Once | INTRAMUSCULAR | Status: DC
  Filled 2017-01-10: qty 0.5

## 2017-01-10 MED ORDER — POTASSIUM CHLORIDE 10 MEQ/100ML IV SOLN
10.0000 meq | INTRAVENOUS | Status: AC
Start: 2017-01-10 — End: 2017-01-10
  Administered 2017-01-10 (×2): 10 meq via INTRAVENOUS

## 2017-01-10 MED ORDER — COSYNTROPIN 0.25 MG IJ SOLR
0.2500 mg | Freq: Once | INTRAMUSCULAR | Status: DC
  Filled 2017-01-10: qty 0.25

## 2017-01-10 MED ORDER — ORAL CARE MOUTH RINSE
15.0000 mL | Freq: Two times a day (BID) | OROMUCOSAL | Status: DC
  Administered 2017-01-12: 15 mL via OROMUCOSAL

## 2017-01-10 MED ORDER — COSYNTROPIN 0.25 MG IJ SOLR
0.2500 mg | Freq: Once | INTRAMUSCULAR | Status: AC
  Administered 2017-01-10: 0.25 mg via INTRAVENOUS
  Filled 2017-01-10: qty 0.25

## 2017-01-10 MED ORDER — PNEUMOCOCCAL VAC POLYVALENT 25 MCG/0.5ML IJ INJ: 0.5000 mL | INJECTION | INTRAMUSCULAR | Status: DC

## 2017-01-10 MED ORDER — FOLIC ACID 5 MG/ML IJ SOLN
1.0000 mg | Freq: Every day | INTRAMUSCULAR | Status: DC
  Administered 2017-01-10: 1 mg via INTRAVENOUS
  Filled 2017-01-10 (×5): qty 0.2

## 2017-01-10 MED ORDER — SODIUM CHLORIDE 0.9 % IV SOLN
INTRAVENOUS | Status: DC
  Administered 2017-01-10: 22:00:00 via INTRAVENOUS

## 2017-01-10 MED ORDER — POTASSIUM CHLORIDE 20 MEQ/15ML (10%) PO SOLN
40.0000 meq | Freq: Two times a day (BID) | ORAL | Status: DC
  Filled 2017-01-10: qty 30

## 2017-01-10 NOTE — Progress Notes (Signed)
Patient insists on being able to eat and keeps asking for "crackers or something". Patient reminded of his choking event in the ED and that he is unable to swallow properly. RN educated patient that after his procedure in the morning, we will likely have a plan for him to begin eating again and that this time without food is only temporary. He is irritable over this. Patient still continues to insist on something to eat, stating "I only choked because they gave me a horse pill".  Will continue to monitor.

## 2017-01-10 NOTE — Care Management Note (Addendum)
Case Management Note  Patient Details  Name: Jermaine Scott MRN: [id number] Date of Birth: 1950/11/28  Subjective/Objective:  From home alone, has   history of asthma, COPD, EtOH abuse admitted with syncope, fall, hypotension. Had an  chocking episode  On potassium pill in ED.   He states a care giver comes out to check his vitals but he is not sure who she is with.    PCP- Hewitt Shorts- he has not be in to see her yet.              Action/Plan: NCM will follow for dc needs.  Expected Discharge Date:  01/12/17               Expected Discharge Plan:     In-House Referral:     Discharge planning Services  CM Consult  Post Acute Care Choice:    Choice offered to:     DME Arranged:    DME Agency:     HH Arranged:    HH Agency:     Status of Service:  In process, will continue to follow  If discussed at Long Length of Stay Meetings, dates discussed:    Additional Comments:  Zenon Mayo, RN 01/10/2017, 9:32 AM

## 2017-01-10 NOTE — Progress Notes (Signed)
Initial Nutrition Assessment  DOCUMENTATION CODES:   Severe malnutrition in context of chronic illness  INTERVENTION:    If unable to safely advance PO diet within the next 48 hours, consider placing Cortrak tube for enteral nutrition.  Jevity 1.2 at 75 ml/h would provide 2160 kcal, 100 gm protein, 1458 ml free water daily.  When feeding (PO or via tube) is initiated, monitor magnesium, potassium, and phosphorus daily for at least 3 days, MD to replete as needed, as pt is at risk for refeeding syndrome given severe PCM with recent minimal oral intake, hx alcohol abuse, and low potassium.  NUTRITION DIAGNOSIS:   Malnutrition (severe) related to chronic illness (dysphagia) as evidenced by severe depletion of body fat, severe depletion of muscle mass, percent weight loss, energy intake < or equal to 75% for > or equal to 1 month (21% weight loss in 6 months).  GOAL:   Patient will meet greater than or equal to 90% of their needs  MONITOR:   Weight trends, Labs, I & O's, Diet advancement  REASON FOR ASSESSMENT:   Consult Assessment of nutrition requirement/status (malnutrition)  ASSESSMENT:   66 yo male with PMH of asthma, COPD, and alcohol abuse who was admitted on 6/19 with syncope, fall, and hypotension. He choked on a potassium pill in the ED.  Patient reports he is hungry, asking for something to eat. He has not been eating well over the past few months and has lost weight, ~40 lbs in 6 months. 21% weight loss within 6 months is significant. Nutrition-Focused physical exam completed. Findings are severe fat depletion, severe muscle depletion, and no edema.  S/P MBS evaluation with SLP today, patient with severe dysphagia, ENT evaluation recommended. With recent dysphagia, patient has been unable to consume adequate nutrition, leading to weight loss and severe PCM. Suspect patient has been consuming </= 75% of estimated energy requirement for >/= 1 month due to dysphagia. Labs  reviewed: potassium 3.3 (L) Medications reviewed and include folic acid, thiamine, MVI, KCl. Patient is at risk for refeeding syndrome given severe PCM with recent minimal oral intake, hx alcohol abuse, and low potassium.  Diet Order:  Diet NPO time specified  Skin:  Reviewed, no issues  Last BM:  unknown  Height:   Ht Readings from Last 1 Encounters:  01/09/17 5\' 10"  (1.778 m)    Weight:   Wt Readings from Last 1 Encounters:  01/09/17 150 lb (68 kg)    Ideal Body Weight:  75.5 kg  BMI:  Body mass index is 21.52 kg/m.  Estimated Nutritional Needs:   Kcal:  2000-2200  Protein:  90-100 gm  Fluid:  2 L  EDUCATION NEEDS:   No education needs identified at this time  Molli Barrows, [place], [place], [place] Pager [phone] After Hours Pager [phone]

## 2017-01-10 NOTE — Consult Note (Signed)
Jermaine Scott, Jermaine Scott 66 y.o., male [id number]     Chief Complaint: syncope  HPI: 66 yo wm, dysphagia with probable dehydration recently.  Admitted after syncopal spell.  Smokes, drinks 2 beers daily.  Some throat pain with carbonated beverages.  Possible trace blood with heavy coughing.  No neck masses.  No hx cancer.  No prior evaluation.    PMH: Past Medical History:  Diagnosis Date  . Asthma   . COPD (chronic obstructive pulmonary disease) ([facility])     Surg Hx: Past Surgical History:  Procedure Laterality Date  . JOINT REPLACEMENT      FHx:   Family History  Problem Relation Age of Onset  . Heart attack Father    SocHx:  reports that he has been smoking.  He has a 50.00 pack-year smoking history. He does not have any smokeless tobacco history on file. He reports that he drinks alcohol. He reports that he does not use drugs.  ALLERGIES:  Allergies  Allergen Reactions  . No Known Allergies     Medications Prior to Admission  Medication Sig Dispense Refill  . aspirin EC 325 MG tablet Take 325 mg by mouth every morning.    Marland Kitchen atorvastatin (LIPITOR) 40 MG tablet Take 40 mg by mouth at bedtime.  0    Results for orders placed or performed during the hospital encounter of 01/09/17 (from the past 48 hour(s))  Comprehensive metabolic panel     Status: Abnormal   Collection Time: 01/09/17  1:54 PM  Result Value Ref Range   Sodium 136 135 - 145 mmol/L   Potassium 3.0 (L) 3.5 - 5.1 mmol/L   Chloride 101 101 - 111 mmol/L   CO2 21 (L) 22 - 32 mmol/L   Glucose, Bld 98 65 - 99 mg/dL   BUN 34 (H) 6 - 20 mg/dL   Creatinine, Ser 1.76 (H) 0.61 - 1.24 mg/dL   Calcium 9.1 8.9 - 10.3 mg/dL   Total Protein 5.9 (L) 6.5 - 8.1 g/dL   Albumin 2.9 (L) 3.5 - 5.0 g/dL   AST 17 15 - 41 U/L   ALT 10 (L) 17 - 63 U/L   Alkaline Phosphatase 57 38 - 126 U/L   Total Bilirubin 1.1 0.3 - 1.2 mg/dL   GFR calc non Af Amer 39 (L) >60 mL/min   GFR calc Af Amer 45 (L) >60 mL/min    Comment: (NOTE) The eGFR  has been calculated using the CKD EPI equation. This calculation has not been validated in all clinical situations. eGFR's persistently <60 mL/min signify possible Chronic Kidney Disease.    Anion gap 14 5 - 15  Troponin I     Status: None   Collection Time: 01/09/17  1:54 PM  Result Value Ref Range   Troponin I <0.03 <0.03 ng/mL  CBC with Differential     Status: Abnormal   Collection Time: 01/09/17  1:54 PM  Result Value Ref Range   WBC 7.6 4.0 - 10.5 K/uL   RBC 4.71 4.22 - 5.81 MIL/uL   Hemoglobin 13.7 13.0 - 17.0 g/dL   HCT 40.4 39.0 - 52.0 %   MCV 85.8 78.0 - 100.0 fL   MCH 29.1 26.0 - 34.0 pg   MCHC 33.9 30.0 - 36.0 g/dL   RDW 15.9 (H) 11.5 - 15.5 %   Platelets 153 150 - 400 K/uL   Neutrophils Relative % 84 %   Neutro Abs 6.4 1.7 - 7.7 K/uL   Lymphocytes Relative 9 %  Lymphs Abs 0.7 0.7 - 4.0 K/uL   Monocytes Relative 6 %   Monocytes Absolute 0.5 0.1 - 1.0 K/uL   Eosinophils Relative 1 %   Eosinophils Absolute 0.0 0.0 - 0.7 K/uL   Basophils Relative 0 %   Basophils Absolute 0.0 0.0 - 0.1 K/uL  Protime-INR     Status: None   Collection Time: 01/09/17  1:54 PM  Result Value Ref Range   Prothrombin Time 15.2 11.4 - 15.2 seconds   INR 1.19   CBG monitoring, ED     Status: None   Collection Time: 01/09/17  1:59 PM  Result Value Ref Range   Glucose-Capillary 86 65 - 99 mg/dL  I-stat troponin, ED     Status: None   Collection Time: 01/09/17  2:04 PM  Result Value Ref Range   Troponin i, poc 0.00 0.00 - 0.08 ng/mL   Comment 3            Comment: Due to the release kinetics of cTnI, a negative result within the first hours of the onset of symptoms does not rule out myocardial infarction with certainty. If myocardial infarction is still suspected, repeat the test at appropriate intervals.   I-Stat CG4 Lactic Acid, ED     Status: Abnormal   Collection Time: 01/09/17  2:07 PM  Result Value Ref Range   Lactic Acid, Venous 1.97 (HH) 0.5 - 1.9 mmol/L  I-stat Chem 8,  ED     Status: Abnormal   Collection Time: 01/09/17  2:09 PM  Result Value Ref Range   Sodium 137 135 - 145 mmol/L   Potassium 3.1 (L) 3.5 - 5.1 mmol/L   Chloride 101 101 - 111 mmol/L   BUN 36 (H) 6 - 20 mg/dL   Creatinine, Ser 9.32 (H) 0.61 - 1.24 mg/dL   Glucose, Bld 93 65 - 99 mg/dL   Calcium, Ion 9.11 (L) 1.15 - 1.40 mmol/L   TCO2 24 0 - 100 mmol/L   Hemoglobin 13.6 13.0 - 17.0 g/dL   HCT 52.4 58.5 - 71.6 %  I-Stat venous blood gas, ED     Status: Abnormal   Collection Time: 01/09/17  2:10 PM  Result Value Ref Range   pH, Ven 7.489 (H) 7.250 - 7.430   pCO2, Ven 33.9 (L) 44.0 - 60.0 mmHg   pO2, Ven 16.0 (LL) 32.0 - 45.0 mmHg   Bicarbonate 25.7 20.0 - 28.0 mmol/L   TCO2 27 0 - 100 mmol/L   O2 Saturation 27.0 %   Acid-Base Excess 3.0 (H) 0.0 - 2.0 mmol/L   Patient temperature HIDE    Collection site BRACHIAL ARTERY    Sample type VENOUS    Comment NOTIFIED PHYSICIAN   Ethanol     Status: None   Collection Time: 01/09/17  2:38 PM  Result Value Ref Range   Alcohol, Ethyl (B) <5 <5 mg/dL    Comment:        LOWEST DETECTABLE LIMIT FOR SERUM ALCOHOL IS 5 mg/dL FOR MEDICAL PURPOSES ONLY   Acetaminophen level     Status: Abnormal   Collection Time: 01/09/17  2:38 PM  Result Value Ref Range   Acetaminophen (Tylenol), Serum <10 (L) 10 - 30 ug/mL    Comment:        THERAPEUTIC CONCENTRATIONS VARY SIGNIFICANTLY. A RANGE OF 10-30 ug/mL MAY BE AN EFFECTIVE CONCENTRATION FOR MANY PATIENTS. HOWEVER, SOME ARE BEST TREATED AT CONCENTRATIONS OUTSIDE THIS RANGE. ACETAMINOPHEN CONCENTRATIONS >150 ug/mL AT 4 HOURS AFTER INGESTION  AND >50 ug/mL AT 12 HOURS AFTER INGESTION ARE OFTEN ASSOCIATED WITH TOXIC REACTIONS.   Salicylate level     Status: None   Collection Time: 01/09/17  2:38 PM  Result Value Ref Range   Salicylate Lvl 36.1 2.8 - 30.0 mg/dL  CK     Status: Abnormal   Collection Time: 01/09/17  3:00 PM  Result Value Ref Range   Total CK 35 (L) 49 - 397 U/L  Magnesium      Status: None   Collection Time: 01/09/17  3:00 PM  Result Value Ref Range   Magnesium 1.9 1.7 - 2.4 mg/dL  Urinalysis, Routine w reflex microscopic     Status: Abnormal   Collection Time: 01/09/17  3:34 PM  Result Value Ref Range   Color, Urine YELLOW YELLOW   APPearance HAZY (A) CLEAR   Specific Gravity, Urine 1.024 1.005 - 1.030   pH 5.0 5.0 - 8.0   Glucose, UA NEGATIVE NEGATIVE mg/dL   Hgb urine dipstick SMALL (A) NEGATIVE   Bilirubin Urine NEGATIVE NEGATIVE   Ketones, ur 20 (A) NEGATIVE mg/dL   Protein, ur NEGATIVE NEGATIVE mg/dL   Nitrite NEGATIVE NEGATIVE   Leukocytes, UA SMALL (A) NEGATIVE   RBC / HPF 6-30 0 - 5 RBC/hpf   WBC, UA 6-30 0 - 5 WBC/hpf   Bacteria, UA RARE (A) NONE SEEN   Squamous Epithelial / LPF 0-5 (A) NONE SEEN   Mucous PRESENT    Hyaline Casts, UA PRESENT   Urine rapid drug screen (hosp performed)     Status: None   Collection Time: 01/09/17  3:34 PM  Result Value Ref Range   Opiates NONE DETECTED NONE DETECTED   Cocaine NONE DETECTED NONE DETECTED   Benzodiazepines NONE DETECTED NONE DETECTED   Amphetamines NONE DETECTED NONE DETECTED   Tetrahydrocannabinol NONE DETECTED NONE DETECTED   Barbiturates NONE DETECTED NONE DETECTED    Comment:        DRUG SCREEN FOR MEDICAL PURPOSES ONLY.  IF CONFIRMATION IS NEEDED FOR ANY PURPOSE, NOTIFY LAB WITHIN 5 DAYS.        LOWEST DETECTABLE LIMITS FOR URINE DRUG SCREEN Drug Class       Cutoff (ng/mL) Amphetamine      1000 Barbiturate      200 Benzodiazepine   224 Tricyclics       497 Opiates          300 Cocaine          300 THC              50   Urine culture     Status: None   Collection Time: 01/09/17  3:34 PM  Result Value Ref Range   Specimen Description URINE, RANDOM    Special Requests ADDED [id number]    Culture NO GROWTH    Report Status 01/10/2017 FINAL   Basic metabolic panel     Status: Abnormal   Collection Time: 01/10/17  3:09 AM  Result Value Ref Range   Sodium 142 135 - 145  mmol/L   Potassium 3.3 (L) 3.5 - 5.1 mmol/L   Chloride 114 (H) 101 - 111 mmol/L   CO2 21 (L) 22 - 32 mmol/L   Glucose, Bld 67 65 - 99 mg/dL   BUN 23 (H) 6 - 20 mg/dL   Creatinine, Ser 1.12 0.61 - 1.24 mg/dL   Calcium 8.0 (L) 8.9 - 10.3 mg/dL   GFR calc non Af Amer >60 >60 mL/min  GFR calc Af Amer >60 >60 mL/min    Comment: (NOTE) The eGFR has been calculated using the CKD EPI equation. This calculation has not been validated in all clinical situations. eGFR's persistently <60 mL/min signify possible Chronic Kidney Disease.    Anion gap 7 5 - 15  CBC     Status: Abnormal   Collection Time: 01/10/17  3:09 AM  Result Value Ref Range   WBC 5.4 4.0 - 10.5 K/uL   RBC 4.28 4.22 - 5.81 MIL/uL   Hemoglobin 12.3 (L) 13.0 - 17.0 g/dL   HCT 37.1 (L) 39.0 - 52.0 %   MCV 86.7 78.0 - 100.0 fL   MCH 28.7 26.0 - 34.0 pg   MCHC 33.2 30.0 - 36.0 g/dL   RDW 16.4 (H) 11.5 - 15.5 %   Platelets 122 (L) 150 - 400 K/uL  HIV antibody     Status: None   Collection Time: 01/10/17  3:09 AM  Result Value Ref Range   HIV Screen 4th Generation wRfx Non Reactive Non Reactive    Comment: (NOTE) Performed At: [facility] [place], [place] [id number] Lindon Romp MD BS:[id number]   MRSA PCR Screening     Status: None   Collection Time: 01/10/17  6:38 AM  Result Value Ref Range   MRSA by PCR NEGATIVE NEGATIVE    Comment:        The GeneXpert MRSA Assay (FDA approved for NASAL specimens only), is one component of a comprehensive MRSA colonization surveillance program. It is not intended to diagnose MRSA infection nor to guide or monitor treatment for MRSA infections.   ACTH stimulation, 3 time points     Status: None   Collection Time: 01/10/17  8:01 AM  Result Value Ref Range   Cortisol, Base 18.9 ug/dL    Comment: NO NORMAL RANGE ESTABLISHED FOR THIS TEST   Cortisol, 30 Min 28.8 ug/dL   Cortisol, 60 Min 37.4 ug/dL  Lactic acid, plasma     Status: None   Collection  Time: 01/10/17  8:01 AM  Result Value Ref Range   Lactic Acid, Venous 0.8 0.5 - 1.9 mmol/L  Troponin I     Status: Abnormal   Collection Time: 01/10/17  8:01 AM  Result Value Ref Range   Troponin I 0.03 (HH) <0.03 ng/mL    Comment: CRITICAL RESULT CALLED TO, READ BACK BY AND VERIFIED WITH: Erma Heritage [id number] WILDERK   Glucose, capillary     Status: None   Collection Time: 01/10/17  9:24 AM  Result Value Ref Range   Glucose-Capillary 70 65 - 99 mg/dL   Comment 1 Notify RN    Comment 2 Document in Chart    Dg Chest 2 View  Result Date: 01/09/2017 CLINICAL DATA:  66 year old male with dyspnea. Generalized weakness. EXAM: CHEST  2 VIEW COMPARISON:  Chest radiograph dated 01/09/2017 FINDINGS: There is hyperinflation of the lungs with flattening of the diaphragms consistent with underlying air trapping possibly related to mild COPD. There is no focal consolidation, pleural effusion, or pneumothorax. Bilateral nipple shadows noted. The cardiac silhouette is within normal limits. Coronary vascular calcifications noted. There is atherosclerotic calcification of the aortic arch. No acute osseous pathology. IMPRESSION: 1. No acute cardiopulmonary process. 2. Probable mild COPD. Electronically Signed   By: Anner Crete M.D.   On: 01/09/2017 20:12   Ct Head Wo Contrast  Result Date: 01/09/2017 CLINICAL DATA:  Weakness. Hypotension. Syncopal episode with fall. Headache. Dizziness. EXAM:  CT HEAD WITHOUT CONTRAST TECHNIQUE: Contiguous axial images were obtained from the base of the skull through the vertex without intravenous contrast. COMPARISON:  12/11/2016 FINDINGS: Brain: Mildly age advanced cerebral volume loss. Mild to moderate low density in the periventricular white matter likely related to small vessel disease. No mass lesion, hemorrhage, hydrocephalus, acute infarct, intra-axial, or extra-axial fluid collection. Vascular: Intracranial atherosclerosis. Skull: No significant soft tissue swelling.   No skull fracture. Sinuses/Orbits: Normal imaged portions of the orbits and globes. Clear paranasal sinuses and mastoid air cells. Other: None. IMPRESSION: 1.  No acute intracranial abnormality. 2.  Cerebral atrophy and small vessel ischemic change. Electronically Signed   By: Abigail Miyamoto M.D.   On: 01/09/2017 16:13   Dg Chest Portable 1 View  Result Date: 01/09/2017 CLINICAL DATA:  Pt c/o SOB and syncope x 1 day. Hx of asthma AND COPD. Pt is a current smoker. EXAM: PORTABLE CHEST 1 VIEW COMPARISON:  01/09/2017 at 1358 hours. FINDINGS: Two frontal radiographs. Numerous leads and wires project over the chest. Remote upper right rib fractures. Midline trachea. Normal heart size. Atherosclerosis in the transverse aorta. No pleural effusion or pneumothorax. Clear lungs. IMPRESSION: Hyperinflation, without acute disease. Aortic Atherosclerosis (ICD10-I70.0). Electronically Signed   By: Abigail Miyamoto M.D.   On: 01/09/2017 19:14   Dg Chest Portable 1 View  Result Date: 01/09/2017 CLINICAL DATA:  Low blood pressure, weakness, smoking history EXAM: PORTABLE CHEST 1 VIEW COMPARISON:  Chest x-ray of 12/11/2016 FINDINGS: The lungs are clear but hyperaerated. Mediastinal and hilar contours are stable and unremarkable. The heart is within normal limits in size. Nipple shadows remain at the lung bases. No bony abnormality is seen with an old healed right midclavicular fracture again noted. IMPRESSION: Hyperaeration.  No active lung disease. Electronically Signed   By: Ivar Drape M.D.   On: 01/09/2017 14:13   Dg Swallowing Func-speech Pathology  Result Date: 01/10/2017 Objective Swallowing Evaluation: Type of Study: MBS-Modified Barium Swallow Study Patient Details Name: Kin Galbraith MRN: [id number] Date of Birth: 14-Jun-1951 Today's Date: 01/10/2017 Time: SLP Start Time (ACUTE ONLY): 1040-SLP Stop Time (ACUTE ONLY): 1055 SLP Time Calculation (min) (ACUTE ONLY): 15 min Past Medical History: Past Medical History:  Diagnosis Date . Asthma  . COPD (chronic obstructive pulmonary disease) ([facility])  Past Surgical History: Past Surgical History: Procedure Laterality Date . JOINT REPLACEMENT   HPI: Billyjoe Go a 66 y.o.malewith a PMH as outlined below. He [facility] ED [facility]/19 with near syncope and headache. He states he woke up and was getting ready to have breakfast but felt dizzy after walking down the hallway causing him to fall to the ground but not lose consciousness. Pt found to be hypotensive. While in ED, he choked on a potassium pill and required heimlich maneuver. After arrival to SDU, he has had a couple of coughing spells followed by loud upper airway wheeze / stridor. Lung fields have remained completely clear. MD observed pt to have prolonged hard coughing spell. Pt with a history of COPD/asthma No Data Recorded Assessment / Plan / Recommendation CHL IP CLINICAL IMPRESSIONS 01/10/2017 Clinical Impression Pt demosntrates a severe oropharyngeal dysphagia. Initial swallow reveal adequate movement of the hyloaryngeal mechanism with airway closure, epiglottic deflection and pharyngeal contraction. However the pt cannot propel 80% of bolus past the cricopharyngeus, causing bolus to push into the vestibule, with eventual regurgitation. Only a tract amount of thin liquids passes below the cords during pts effort. Even with increased effortful swallow from pt, there is  minimal bolus passage through the CP segment. What is transited is seen to surge back to cervical esophagus post swallow. Presentation high concerning for abmormality of the cervical esophagus. There is question of abmormal tissue near the arytenoids as well, though is poorly visualized on the MBS. Clearly pt has no ability to functionally consume nutrition or hydration which would explain his presentation in the hospital. He initally denied difficulty swallowing, but now endorses he has been struggling for some time. Pt may attempt sips of water for  pleasure, and though he will regurgitate them, aspiration risk is low given good ariway protection during attempts. Discussed with MD and suggested referral to ENT for visualization fothe pharynx larynx, though GI may be needed depending on finding. Will follow as needed.  SLP Visit Diagnosis Dysphagia, pharyngoesophageal phase (R13.14) Attention and concentration deficit following -- Frontal lobe and executive function deficit following -- Impact on safety and function Mild aspiration risk;Risk for inadequate nutrition/hydration   CHL IP TREATMENT RECOMMENDATION 01/10/2017 Treatment Recommendations Other (Comment)   No flowsheet data found. CHL IP DIET RECOMMENDATION 01/10/2017 SLP Diet Recommendations Thin liquid Liquid Administration via Cup Medication Administration Via alternative means Compensations -- Postural Changes --   CHL IP OTHER RECOMMENDATIONS 01/10/2017 Recommended Consults Consider ENT evaluation Oral Care Recommendations Oral care BID Other Recommendations --   No flowsheet data found.  No flowsheet data found.     CHL IP ORAL PHASE 01/10/2017 Oral Phase WFL Oral - Pudding Teaspoon -- Oral - Pudding Cup -- Oral - Honey Teaspoon -- Oral - Honey Cup -- Oral - Nectar Teaspoon -- Oral - Nectar Cup -- Oral - Nectar Straw -- Oral - Thin Teaspoon -- Oral - Thin Cup -- Oral - Thin Straw -- Oral - Puree -- Oral - Mech Soft -- Oral - Regular -- Oral - Multi-Consistency -- Oral - Pill -- Oral Phase - Comment --  CHL IP PHARYNGEAL PHASE 01/10/2017 Pharyngeal Phase Impaired Pharyngeal- Pudding Teaspoon -- Pharyngeal -- Pharyngeal- Pudding Cup -- Pharyngeal -- Pharyngeal- Honey Teaspoon -- Pharyngeal -- Pharyngeal- Honey Cup -- Pharyngeal -- Pharyngeal- Nectar Teaspoon -- Pharyngeal -- Pharyngeal- Nectar Cup Penetration/Apiration after swallow;Trace aspiration Pharyngeal Material enters airway, passes BELOW cords then ejected out;Material enters airway, passes BELOW cords without attempt by patient to eject out  (silent aspiration) Pharyngeal- Nectar Straw -- Pharyngeal -- Pharyngeal- Thin Teaspoon -- Pharyngeal -- Pharyngeal- Thin Cup Penetration/Apiration after swallow;Trace aspiration Pharyngeal -- Pharyngeal- Thin Straw -- Pharyngeal -- Pharyngeal- Puree NT Pharyngeal -- Pharyngeal- Mechanical Soft -- Pharyngeal -- Pharyngeal- Regular -- Pharyngeal -- Pharyngeal- Multi-consistency -- Pharyngeal -- Pharyngeal- Pill -- Pharyngeal -- Pharyngeal Comment --  CHL IP CERVICAL ESOPHAGEAL PHASE 01/10/2017 Cervical Esophageal Phase Impaired Pudding Teaspoon -- Pudding Cup -- Honey Teaspoon -- Honey Cup -- Nectar Teaspoon -- Nectar Cup Reduced cricopharyngeal relaxation;Esophageal backflow into cervical esophagus Nectar Straw -- Thin Teaspoon -- Thin Cup Reduced cricopharyngeal relaxation;Esophageal backflow into cervical esophagus Thin Straw -- Puree -- Mechanical Soft -- Regular -- Multi-consistency -- Pill -- Cervical Esophageal Comment -- No flowsheet data found. [name], [place] CCC-SLP [phone] [place], Katherene Ponto 01/10/2017, 11:32 AM               ROS: no SOB  Blood pressure 104/75, pulse (!) 53, temperature 97.7 F (36.5 C), temperature source Oral, resp. rate 14, height '5\' 10"'$  (1.778 m), weight 68 kg (150 lb), SpO2 100 %.  PHYSICAL EXAM: Overall appearance:  Emaciated.  Heavy facial hair.  Voice gravelly and somewhat difficult  to understand.  No stridor.  Some throat clearing but no actual cough.  Pooled pharyngeal phlegm voice quality. Head: NCAT Ears: clear Nose:  clear Oral Cavity:  2 lower teeth.  Moist. Oral Pharynx/Hypopharynx/Larynx:  2x3cm mass behind RIGHT posterior tonsillar pillar.   Neuro: grossly intact Neck:  No neck masses  With informed consent, using 16m 2% viscous xylocaine per nostrils, flexible laryngoscopy performed.  Vocal cords mobile.  Pooled secretions in entire pharynx which pt could not clear.    Studies Reviewed: none    Assessment/Plan Possible oropharyngeal  lesion.  Possible hypopharyngeal/esophageal obstructing lesion with dysphagia, dehydration, syncope.    Plan:  In the future, a formal barium swallow will provide more information than a modified barium swallow.  I plan to do pan-endoscopy and possible biopsy tomorrow AM.  Discussed with patient including risks  And complications.  Questions were answered. Informed consent obtained.  WIll likely need neck CT scan when kidneys are working well.    WJodi [id number] 6:55 PM

## 2017-01-10 NOTE — Consult Note (Signed)
Name: Jermaine Scott MRN: [id number] DOB: 11/11/1950    ADMISSION DATE:  01/09/2017 CONSULTATION DATE:  01/09/17  REFERRING MD :  Beryle Beams  CHIEF COMPLAINT:  Hypotension   HISTORY OF PRESENT ILLNESS:  Jermaine Scott is a 66 y.o. male with a PMH as outlined below.  He presented to [facility] ED 6/19 with near syncope and headache.  He states he woke up and was getting ready to have breakfast but felt dizzy after walking down the hallway causing him to fall to the ground but not lose consciousness.  He had no preceding symptoms and had not had any the night prior either. He apparently had not been eating or drinking much due to the extreme heat over the past few days; however, he felt he had a good meal the night prior before going to bed.  In ED, he was found to be hypotensive.  He was given IVF and had improvement in pressures; however, after being admitted to SDU, he continued to have borderline BP's into early AM 01/10/17.  PCCM was subsequently asked to see in consultation.  He has had SBP ranging anywhere in the high 60's up to low 100's.  MAP's have been consistently > 55 and he has continued to have normal mental status.  He has had roughly 4.5 - 5L NS and has had ~ 300cc of UOP since arriving to the floor.  He denies any recent fevers/chills/sweats, chest pain, SOB, N/V/D, abd pain, myalgias.  Of note, while in ED, he choked on a potassium pill and required heimlich maneuver.  After arrival to SDU, he has had a couple of coughing spells followed by loud upper airway wheeze / stridor.  Lung fields have remained completely clear.  He had one of these episodes while I was examining him and we were able to calm him down using breathing techniques after roughly 2 minutes.  PAST MEDICAL HISTORY :   has a past medical history of Asthma and COPD (chronic obstructive pulmonary disease) ([place]).  has a past surgical history that includes Joint replacement. Prior to Admission medications   Medication Sig  Start Date End Date Taking? Authorizing Provider  aspirin EC 325 MG tablet Take 325 mg by mouth every morning.   Yes [provider]  atorvastatin (LIPITOR) 40 MG tablet Take 40 mg by mouth at bedtime. 12/22/16  Yes [provider]   No Known Allergies  FAMILY HISTORY:  family history includes Heart attack in his father. SOCIAL HISTORY:  reports that he has been smoking.  He has a 50.00 pack-year smoking history. He does not have any smokeless tobacco history on file. He reports that he drinks alcohol. He reports that he does not use drugs.  REVIEW OF SYSTEMS:   All negative; except for those that are bolded, which indicate positives.  Constitutional: weight loss, weight gain, night sweats, fevers, chills, fatigue, weakness.  HEENT: headaches, sore throat, sneezing, nasal congestion, post nasal drip, difficulty swallowing, tooth/dental problems, visual complaints, visual changes, ear aches. Neuro: difficulty with speech, weakness, numbness, ataxia, dizziness - resolved. CV:  chest pain, orthopnea, PND, swelling in lower extremities, dizziness, palpitations, syncope.  Resp: cough, hemoptysis, dyspnea, wheezing. GI: heartburn, indigestion, abdominal pain, nausea, vomiting, diarrhea, constipation, change in bowel habits, loss of appetite, hematemesis, melena, hematochezia.  GU: dysuria, change in color of urine, urgency or frequency, flank pain, hematuria. MSK: joint pain or swelling, decreased range of motion. Psych: change in mood or affect, depression, anxiety, suicidal ideations, homicidal ideations. Skin: rash,  itching, bruising.   SUBJECTIVE:  Currently denies SOB, chest pain.  Asking for water to drink.  VITAL SIGNS: Temp:  [97.3 F (36.3 C)-99 F (37.2 C)] 98.2 F (36.8 C) (06/20 0417) Pulse Rate:  [50-79] 55 (06/20 0315) Resp:  [13-24] 18 (06/20 0315) BP: (58-127)/(39-95) 81/57 (06/20 0315) SpO2:  [96 %-100 %] 99 % (06/20 0315) Weight:  [68 kg (150 lb)] 68  kg (150 lb) (06/19 1340)  PHYSICAL EXAMINATION: General: Adult male, resting in bed, in NAD. Neuro: A&O x 3, non-focal.  HEENT: [place]/AT. PERRL, sclerae anicteric. Cardiovascular: RRR, no M/R/G.  Lungs: Respirations even and unlabored.  CTA bilaterally, No W/R/R.   Had one episode of upper airway stridor / wheezing after coughing spell that was c/w VCD. Abdomen: BS x 4, soft, NT/ND.  Musculoskeletal: No gross deformities, no edema.  Skin: Intact, warm, no rashes.    Recent Labs Lab 01/09/17 1354 01/09/17 1409 01/10/17 0309  NA 136 137 142  K 3.0* 3.1* 3.3*  CL 101 101 114*  CO2 21*  --  21*  BUN 34* 36* 23*  CREATININE 1.76* 1.40* 1.12  GLUCOSE 98 93 67    Recent Labs Lab 01/09/17 1354 01/09/17 1409 01/10/17 0309  HGB 13.7 13.6 12.3*  HCT 40.4 40.0 37.1*  WBC 7.6  --  5.4  PLT 153  --  122*   Dg Chest 2 View  Result Date: 01/09/2017 CLINICAL DATA:  66 year old male with dyspnea. Generalized weakness. EXAM: CHEST  2 VIEW COMPARISON:  Chest radiograph dated 01/09/2017 FINDINGS: There is hyperinflation of the lungs with flattening of the diaphragms consistent with underlying air trapping possibly related to mild COPD. There is no focal consolidation, pleural effusion, or pneumothorax. Bilateral nipple shadows noted. The cardiac silhouette is within normal limits. Coronary vascular calcifications noted. There is atherosclerotic calcification of the aortic arch. No acute osseous pathology. IMPRESSION: 1. No acute cardiopulmonary process. 2. Probable mild COPD. Electronically Signed   By: Anner Crete M.D.   On: 01/09/2017 20:12   Ct Head Wo Contrast  Result Date: 01/09/2017 CLINICAL DATA:  Weakness. Hypotension. Syncopal episode with fall. Headache. Dizziness. EXAM: CT HEAD WITHOUT CONTRAST TECHNIQUE: Contiguous axial images were obtained from the base of the skull through the vertex without intravenous contrast. COMPARISON:  12/11/2016 FINDINGS: Brain: Mildly age advanced  cerebral volume loss. Mild to moderate low density in the periventricular white matter likely related to small vessel disease. No mass lesion, hemorrhage, hydrocephalus, acute infarct, intra-axial, or extra-axial fluid collection. Vascular: Intracranial atherosclerosis. Skull: No significant soft tissue swelling.  No skull fracture. Sinuses/Orbits: Normal imaged portions of the orbits and globes. Clear paranasal sinuses and mastoid air cells. Other: None. IMPRESSION: 1.  No acute intracranial abnormality. 2.  Cerebral atrophy and small vessel ischemic change. Electronically Signed   By: Abigail Miyamoto M.D.   On: 01/09/2017 16:13   Dg Chest Portable 1 View  Result Date: 01/09/2017 CLINICAL DATA:  Pt c/o SOB and syncope x 1 day. Hx of asthma AND COPD. Pt is a current smoker. EXAM: PORTABLE CHEST 1 VIEW COMPARISON:  01/09/2017 at 1358 hours. FINDINGS: Two frontal radiographs. Numerous leads and wires project over the chest. Remote upper right rib fractures. Midline trachea. Normal heart size. Atherosclerosis in the transverse aorta. No pleural effusion or pneumothorax. Clear lungs. IMPRESSION: Hyperinflation, without acute disease. Aortic Atherosclerosis (ICD10-I70.0). Electronically Signed   By: Abigail Miyamoto M.D.   On: 01/09/2017 19:14   Dg Chest Portable 1 View  Result Date:  01/09/2017 CLINICAL DATA:  Low blood pressure, weakness, smoking history EXAM: PORTABLE CHEST 1 VIEW COMPARISON:  Chest x-ray of 12/11/2016 FINDINGS: The lungs are clear but hyperaerated. Mediastinal and hilar contours are stable and unremarkable. The heart is within normal limits in size. Nipple shadows remain at the lung bases. No bony abnormality is seen with an old healed right midclavicular fracture again noted. IMPRESSION: Hyperaeration.  No active lung disease. Electronically Signed   By: Ivar Drape M.D.   On: 01/09/2017 14:13    STUDIES:  CT head 6/19 > no acute process. CXR 6/19 > no acute process. Echo 6/20 >  Carotid US  6/20 >   SIGNIFICANT EVENTS  6/19 > admit.  ASSESSMENT / PLAN:  Hypotension - presumed hypovolemic as he has responded to IVF throughout the night; however, he drops BP somewhat after boluses are complete.  Do not suspect an infectious / septic component. Plan: Additional 1L bolus now then continue MIVF @ 125. F/u on ACTH results (has already gotten cosyntropin and ACTH already ordered by primary team). F/u on echo. Accept MAPs > 55 as long as mental status remains normal.  Near syncope - possibly orthostatic; see above. Plan: F/u echo. Assess carotid dopplers.  COPD per report - without exacerbation.  No PFT's in system. Plan: Continue BD's. Can consider PFT's once over acute issues.  VCD. Plan: Low dose Ativan PRN - caution with borderline BP's.  AKI - presumed pre-renal from hypovolemia. Plan: Continue IVF's.  ? Dysphagia - choked on potassium pill in ED. Plan: F/u on swallow eval.    Montey Hora, PA - [facility] Pager: [phone]  or [phone] 01/10/2017, 5:21 AM

## 2017-01-10 NOTE — Progress Notes (Signed)
Subjective:  Patient states he still does not feel well with some mild headache and mild SOB.  He does not have severe shortness of breath at this time, but doesn't think the duoneb helped all that much.  He denies any other symptoms including CP, abd pain, lightheaded or dizziness.   Objective:  Vital signs in last 24 hours: Vitals:   01/10/17 0417 01/10/17 0500 01/10/17 0600 01/10/17 0737  BP:  107/74 (!) 86/63 102/72  Pulse:  (!) 54 (!) 52   Resp:  13 14   Temp: 98.2 F (36.8 C)   98.3 F (36.8 C)  TempSrc: Oral   Axillary  SpO2:  100% 97%   Weight:      Height:       Weight change:   Intake/Output Summary (Last 24 hours) at 01/10/17 1054 Last data filed at 01/10/17 0600  Gross per 24 hour  Intake          5465.25 ml  Output              300 ml  Net          5165.25 ml   Gen: Sitting up in bed in NAD.   Neuro:  Alert and oriented x3. Strength 5/5 bilaterally in upper and lower extremities. DTR wnl. HEENT: NCAT. EOMI. Moist mucous membranes. No thrush. No cervical LAD.  CV: RRR with no murmurs, rubs, or gallops. Pulm:  CTAB, no wheezes or rhonchi. Abdomen: Soft, non-tender to palpation in four quadrants.  Normal BS appreciated.  Extremities: Purpuric vascular changes on arms.  No LE edema.  Pulses in all extremities intact.  Assessment/Plan:  Principal Problem:   Syncope Active Problems:   COPD (chronic obstructive pulmonary disease) ([facility])   Hypotension   AKI (acute kidney injury) ([place])   Oral candidiasis   Dyspnea  Hypotension and Near syncope: Speech therapy today says that he was not able to tolerate liquid without choking and spitting up.  She suspects an abnormality at the level of the pharynx given evaluation.  This would explain his severe dehydration and possible malnutrition.  We will consult ENT to help [place] visualize any possible obstruction.  He is now s/p 6L of fluid in the hospital. However, given his recurrent hypotension and bradycardia,  cardiac-related disease such as alcoholic cardiomyopathy are also still on our differential.  We will evaluate this today with an ECHO. Additionally, his hypotension with bradycardia, and dysphagia does raise concern for autonomic neuropathy.  We had also considered AI with his hypotension and purpuric skin changes.  However, he did stimulate appropriately on ACTH stim test today.  -  continue telemetry monitoring - consult ENT for possible pharyngeal obstruction - continue NS at 150 ml/hr given NPO - ECHO - nutrition consult given appears malnourished - orthostatic bps - Check blood cultures - PT / OT evals  Dysphagia; likely oropharyngeal: As mentioned above, speech therapy today notes he is not able to tolerate even clear liquids without choking or spitting up.  Additionally, he has been complaining of a 3-4 day history of sore throat with thrush on initial exam that has since resolved.   - NPO with aspiration risk  - IV fluconazole 100 mg  - f/u HIV  Dyspnea: Seems to have improved.  This may have been related to an attempt to eat and drink in the ED as he was coughing up a lot of sputum / respiratory secretions during the episode. However, his underlying COPD may also be  playing a role. We will try duoneb again if he has an episode of dysnpea now that he is NPO. - duoneb q6 PRN for shortness of breath - flutter valve PRN   AKI:   Improving with IVF.  Serum creatinine has improved to 1.12 today from 1.76 yesterday.  His with baseline appears to be around 0.8 in 2017. - continue NS at 150 ml/hr  - will recheck in BMP AM  Alcohol use disorder: Patient states he uses around 12 beers a week and does not have a history of withdrawal.  - CIWA protocol  - continue thiamine and folate supplementation  Hypokalemia:  Replete with IV K given NPO with 40 mEq    LOS: 1 day   Doug Sou, Medical Student 01/10/2017, 10:54 AM  Attestation for Student Documentation:  I personally  was present and performed or re-performed the history, physical exam and medical decision-making activities of this service and have verified that the service and findings are accurately documented in the student's note.  Zada Finders, MD 01/10/2017, 11:53 AM

## 2017-01-10 NOTE — Progress Notes (Signed)
MD notified about SBP's in the 84's.Plan is to give bolus and monitor.

## 2017-01-10 NOTE — Evaluation (Signed)
Physical Therapy Evaluation Patient Details Name: Jermaine Scott MRN: [id number] DOB: 1951-04-28 Today's Date: 01/10/2017   History of Present Illness  Pt is a 66 yo male admitted 01/09/17 after bout of syncope resulting in mechanical fall. pt hypotensive and malnurished. PMH significant for asthm, COPD and alcohol abuse.   Clinical Impression  Pt admitted with above diagnosis. Pt currently with functional limitations due to the deficits listed below (see PT Problem List). Pt min guard for transfers and minA for ambulation of 35 feet with RW. Pt limited by weakness and dizziness.  Pt will benefit from skilled PT to increase their independence and safety with mobility to allow discharge to the venue listed below.       Follow Up Recommendations Home health PT;Supervision/Assistance - 24 hour    Equipment Recommendations  Rolling walker with 5" wheels    Recommendations for Other Services OT consult     Precautions / Restrictions Precautions Precautions: Fall Precaution Comments: hypotensive Restrictions Weight Bearing Restrictions: No      Mobility  Bed Mobility               General bed mobility comments: seated EoB at entry  Transfers Overall transfer level: Needs assistance Equipment used: Rolling walker (2 wheeled) Transfers: Sit to/from Stand Sit to Stand: Min guard         General transfer comment: min guard for safety, able to steady himself at walker  Ambulation/Gait Ambulation/Gait assistance: Min assist Ambulation Distance (Feet): 35 Feet Assistive device: Rolling walker (2 wheeled) Gait Pattern/deviations: Step-through pattern;Shuffle;Decreased step length - right;Decreased step length - left;Drifts right/left Gait velocity: slowed Gait velocity interpretation: Below normal speed for age/gender General Gait Details: minA for steadying as he became dizzy with ambulation of 20 feet, no LoB, no buckling, safely returned to room       Balance Overall  balance assessment: Needs assistance Sitting-balance support: No upper extremity supported;Feet supported Sitting balance-Leahy Scale: Good     Standing balance support: No upper extremity supported Standing balance-Leahy Scale: Fair Standing balance comment: able to steady himself in standing before reaching for RW                             Pertinent Vitals/Pain Pain Assessment: No/denies pain    Home Living Family/patient expects to be discharged to:: Private residence Living Arrangements: Alone   Type of Home: Apartment Home Access: Stairs to enter Entrance Stairs-Rails: Can reach both Entrance Stairs-Number of Steps: 3 Home Layout: One level Home Equipment: Cane - single point      Prior Function Level of Independence: Independent with assistive device(s) (walks with cane)         Comments: reports he is able to go down the street to the grocery store but unable to state last time he went     Hand Dominance        Extremity/Trunk Assessment   Upper Extremity Assessment Upper Extremity Assessment: Generalized weakness    Lower Extremity Assessment Lower Extremity Assessment: Generalized weakness    Cervical / Trunk Assessment Cervical / Trunk Assessment: Kyphotic  Communication   Communication: No difficulties  Cognition Arousal/Alertness: Awake/alert Behavior During Therapy: Agitated (hungry and asking for something to eat) Overall Cognitive Status: No family/caregiver present to determine baseline cognitive functioning  General Comments: pt agitated that he is not able to eat after failing his swallow testing       General Comments General comments (skin integrity, edema, etc.): at rest BP 105/73 HR 78 SaO2 on RA 100%, after returning to room BP took 2 attempts and had recovered to 102/75, HR 88 and SaO2 100%        Assessment/Plan    PT Assessment Patient needs continued PT services   PT Problem List Decreased strength;Decreased activity tolerance;Decreased balance;Decreased mobility       PT Treatment Interventions DME instruction;Gait training;Stair training;Functional mobility training;Therapeutic activities;Therapeutic exercise;Balance training;Patient/family education    PT Goals (Current goals can be found in the Care Plan section)  Acute Rehab PT Goals Patient Stated Goal: get something to eat PT Goal Formulation: With patient Time For Goal Achievement: 01/24/17 Potential to Achieve Goals: Fair    Frequency Min 3X/week   Barriers to discharge Decreased caregiver support         AM-PAC PT "6 Clicks" Daily Activity  Outcome Measure Difficulty turning over in bed (including adjusting bedclothes, sheets and blankets)?: A Little Difficulty moving from lying on back to sitting on the side of the bed? : A Little Difficulty sitting down on and standing up from a chair with arms (e.g., wheelchair, bedside commode, etc,.)?: A Little Help needed moving to and from a bed to chair (including a wheelchair)?: A Little Help needed walking in hospital room?: A Little Help needed climbing 3-5 steps with a railing? : A Lot 6 Click Score: 17    End of Session Equipment Utilized During Treatment: Gait belt Activity Tolerance: Other (comment) (dizziness suspectedly due to hypotension) Patient left: in bed;with call bell/phone within reach;with bed alarm set Nurse Communication: Other (comment);Mobility status (request for water) PT Visit Diagnosis: Unsteadiness on feet (R26.81);Repeated falls (R29.6);History of falling (Z91.81);Muscle weakness (generalized) (M62.81);Other abnormalities of gait and mobility (R26.89);Dizziness and giddiness (R42)    Time: 0511-0211 PT Time Calculation (min) (ACUTE ONLY): 40 min   Charges:   PT Evaluation $PT Eval Moderate Complexity: 1 Procedure PT Treatments $Gait Training: 8-22 mins $Therapeutic Activity: 8-22 mins   PT G Codes:         Thelma Lorenzetti B. Migdalia Dk PT, DPT Acute Rehabilitation  [phone] Pager [phone]    Dante 01/10/2017, 3:47 PM

## 2017-01-10 NOTE — Progress Notes (Signed)
Modified Barium Swallow Progress Note  Patient Details  Name: Jermaine Scott MRN: [id number] Date of Birth: August 23, 1950  Today's Date: 01/10/2017  Modified Barium Swallow completed.  Full report located under Chart Review in the Imaging Section.  Brief recommendations include the following:  Clinical Impression  Pt demosntrates a severe oropharyngeal dysphagia. Initial swallow revealed adequate movement of the hyolaryngeal mechanism with airway closure, epiglottic deflection and pharyngeal contraction. However the pt cannot propel 80% of bolus past the cricopharyngeus, causing bolus to push into the vestibule, with eventual regurgitation. Only a small amount of thin liquids passed below the cords during pts effort. Even with increased effortful swallow from pt, there is minimal bolus passage through the CP segment. What is transited is seen to surge back to cervical esophagus post swallow. Presentation concerning for abmormality of the cervical esophagus. There is question of abmormal tissue near the arytenoids as well, though is poorly visualized on the MBS.   Clearly pt has no ability to functionally consume nutrition or hydration which would explain his presentation in the hospital. He initally denied difficulty swallowing, but now endorses he has been struggling for some time. Pt may attempt sips of water for pleasure, and though he will regurgitate them, aspiration risk is low given good ariway protection during attempts. Discussed with MD and suggested referral to ENT for visualization fothe pharynx/larynx, though GI may be needed depending on finding. Will follow as needed.    Swallow Evaluation Recommendations   Recommended Consults: Consider ENT evaluation   SLP Diet Recommendations: Thin liquid   Liquid Administration via: Cup   Medication Administration: Via alternative means               Oral Care Recommendations: Oral care BID       Jermaine [place], MA CCC-SLP  [phone]  Jermaine Scott, Jermaine Scott 01/10/2017,11:27 AM

## 2017-01-10 NOTE — Progress Notes (Signed)
VASCULAR LAB PRELIMINARY  PRELIMINARY  PRELIMINARY  PRELIMINARY  Carotid duplex completed.    Preliminary report:  Bilateral:  1-39% ICA stenosis.  Vertebral artery flow is antegrade.     Timoteo Carreiro, RVS 01/10/2017, 12:56 PM

## 2017-01-10 NOTE — Evaluation (Signed)
Clinical/Bedside Swallow Evaluation Patient Details  Name: Jermaine Scott MRN: [id number] Date of Birth: 1951-06-04  Today's Date: 01/10/2017 Time: SLP Start Time (ACUTE ONLY): 0830 SLP Stop Time (ACUTE ONLY): 0840 SLP Time Calculation (min) (ACUTE ONLY): 10 min  Past Medical History:  Past Medical History:  Diagnosis Date  . Asthma   . COPD (chronic obstructive pulmonary disease) ([place])    Past Surgical History:  Past Surgical History:  Procedure Laterality Date  . JOINT REPLACEMENT     HPI:  [name] a 66 y.o.malewith a PMH as outlined below. He presented to [facility] ED 6/19 with near syncope and headache. He states he woke up and was getting ready to have breakfast but felt dizzy after walking down the hallway causing him to fall to the ground but not lose consciousness. Pt found to be hypotensive. While in ED, he choked on a potassium pill and required heimlich maneuver. After arrival to SDU, he has had a couple of coughing spells followed by loud upper airway wheeze / stridor. Lung fields have remained completely clear. MD observed pt to have prolonged hard coughing spell. Pt with a history of COPD/asthma   Assessment / Plan / Recommendation Clinical Impression  Pt demonstrates severely impaired swallow function with wet vocal quality and poor secretion management at baseline. When PO trials are given , weak multiple swallows elicited, followed by throat clearing and expectoration of liquids and puree. Pt with tray of thick pink tinged secretions at bedside. Pt will need objective assessment of swallowing to determine best texture, strategies to facilitate intake. Pt agreeable to plan  SLP Visit Diagnosis: Dysphagia, pharyngoesophageal phase (R13.14)    Aspiration Risk  Moderate aspiration risk;Risk for inadequate nutrition/hydration    Diet Recommendation Thin liquid          Swallow Study   General HPI: [name] a 66 y.o.malewith a PMH as outlined below. He  presented to [facility] ED 6/19 with near syncope and headache. He states he woke up and was getting ready to have breakfast but felt dizzy after walking down the hallway causing him to fall to the ground but not lose consciousness. Pt found to be hypotensive. While in ED, he choked on a potassium pill and required heimlich maneuver. After arrival to SDU, he has had a couple of coughing spells followed by loud upper airway wheeze / stridor. Lung fields have remained completely clear. MD observed pt to have prolonged hard coughing spell. Pt with a history of COPD/asthma Type of Study: Bedside Swallow Evaluation Previous Swallow Assessment: none  Diet Prior to this Study: NPO Temperature Spikes Noted: No Respiratory Status: Room air History of Recent Intubation: No Behavior/Cognition: Alert;Cooperative Oral Cavity Assessment: Within Functional Limits Oral Care Completed by SLP: No Oral Cavity - Dentition: Poor condition;Missing dentition Vision: Functional for self-feeding Self-Feeding Abilities: Able to feed self Patient Positioning: Upright in bed Baseline Vocal Quality: Wet Volitional Cough: Weak Volitional Swallow: Able to elicit    Oral/Motor/Sensory Function Overall Oral Motor/Sensory Function: Within functional limits   Ice Chips     Thin Liquid Thin Liquid: Impaired Presentation: Cup;Self Fed Pharyngeal  Phase Impairments: Decreased hyoid-laryngeal movement;Multiple swallows;Wet Vocal Quality;Throat Clearing - Immediate    Nectar Thick Nectar Thick Liquid: Not tested   Honey Thick Honey Thick Liquid: Not tested   Puree Puree: Impaired Presentation: Spoon Pharyngeal Phase Impairments: Decreased hyoid-laryngeal movement;Wet Vocal Quality;Throat Clearing - Immediate Other Comments: expectorated majority of bolus   Solid   GO   Solid: Not tested  [name], [place] CCC-SLP [phone]   [name], Katherene Ponto 01/10/2017,8:53 AM

## 2017-01-10 NOTE — Progress Notes (Signed)
MD notified again about SBP being low 81/57 adjustment to IVF made and MD will continue to monitor.

## 2017-01-11 ENCOUNTER — Encounter (HOSPITAL_COMMUNITY)
Admission: EM | Discharge status: Against Medical Advice | Payer: Self-pay | Source: Home / Self Care | Attending: Oncology

## 2017-01-11 ENCOUNTER — Ambulatory Visit: Admit: 2017-01-11 | Payer: Medicare Other | Admitting: Radiation Oncology

## 2017-01-11 ENCOUNTER — Encounter (HOSPITAL_COMMUNITY): Payer: Self-pay

## 2017-01-11 ENCOUNTER — Ambulatory Visit
Admit: 2017-01-11 | Discharge: 2017-01-11 | Disposition: A | Discharge status: Home / Self Care | Payer: Medicare Other | Attending: Radiation Oncology | Admitting: Radiation Oncology

## 2017-01-11 ENCOUNTER — Inpatient Hospital Stay (HOSPITAL_COMMUNITY): Payer: Medicare Other

## 2017-01-11 ENCOUNTER — Inpatient Hospital Stay (HOSPITAL_COMMUNITY): Payer: Medicare Other | Admitting: Certified Registered Nurse Anesthetist

## 2017-01-11 DIAGNOSIS — Z9889 Other specified postprocedural states: Secondary | ICD-10-CM

## 2017-01-11 DIAGNOSIS — E876 Hypokalemia: Secondary | ICD-10-CM

## 2017-01-11 DIAGNOSIS — J387 Other diseases of larynx: Secondary | ICD-10-CM

## 2017-01-11 DIAGNOSIS — J392 Other diseases of pharynx: Secondary | ICD-10-CM | POA: Insufficient documentation

## 2017-01-11 HISTORY — PX: ESOPHAGOSCOPY: SHX5534

## 2017-01-11 HISTORY — PX: FLEXIBLE BRONCHOSCOPY: SHX5094

## 2017-01-11 HISTORY — PX: DIRECT LARYNGOSCOPY: SHX5326

## 2017-01-11 LAB — BASIC METABOLIC PANEL
Anion gap: 5 (ref 5–15)
BUN: 15 mg/dL (ref 6–20)
CHLORIDE: 114 mmol/L — AB (ref 101–111)
CO2: 20 mmol/L — AB (ref 22–32)
CREATININE: 0.91 mg/dL (ref 0.61–1.24)
Calcium: 8.4 mg/dL — ABNORMAL LOW (ref 8.9–10.3)
GFR calc non Af Amer: >60 mL/min (ref >60)
Glucose, Bld: 78 mg/dL (ref 65–99)
POTASSIUM: 3.4 mmol/L — AB (ref 3.5–5.1)
Sodium: 139 mmol/L (ref 135–145)

## 2017-01-11 LAB — GLUCOSE, CAPILLARY: Glucose-Capillary: 67 mg/dL (ref 65–99)

## 2017-01-11 SURGERY — LARYNGOSCOPY, DIRECT
Anesthesia: General | Site: Throat

## 2017-01-11 MED ORDER — MIDAZOLAM HCL 2 MG/2ML IJ SOLN
INTRAMUSCULAR | Status: AC
  Filled 2017-01-11: qty 2

## 2017-01-11 MED ORDER — LACTATED RINGERS IV SOLN
INTRAVENOUS | Status: DC | PRN
  Administered 2017-01-11: 10:00:00 via INTRAVENOUS

## 2017-01-11 MED ORDER — NICOTINE 21 MG/24HR TD PT24
21.0000 mg | MEDICATED_PATCH | Freq: Every day | TRANSDERMAL | Status: DC
  Administered 2017-01-11 – 2017-01-12 (×2): 21 mg via TRANSDERMAL
  Filled 2017-01-11 (×2): qty 1

## 2017-01-11 MED ORDER — LIDOCAINE HCL (CARDIAC) 20 MG/ML IV SOLN
INTRAVENOUS | Status: DC | PRN
  Administered 2017-01-11: 80 mg via INTRAVENOUS

## 2017-01-11 MED ORDER — DEXAMETHASONE SODIUM PHOSPHATE 10 MG/ML IJ SOLN
INTRAMUSCULAR | Status: AC
  Filled 2017-01-11: qty 1

## 2017-01-11 MED ORDER — ONDANSETRON HCL 4 MG/2ML IJ SOLN
INTRAMUSCULAR | Status: AC
  Filled 2017-01-11: qty 2

## 2017-01-11 MED ORDER — EPINEPHRINE PF 1 MG/ML IJ SOLN
INTRAMUSCULAR | Status: DC | PRN
  Administered 2017-01-11: 30 mL

## 2017-01-11 MED ORDER — 0.9 % SODIUM CHLORIDE (POUR BTL) OPTIME
TOPICAL | Status: DC | PRN
  Administered 2017-01-11: 1000 mL

## 2017-01-11 MED ORDER — EPINEPHRINE HCL (NASAL) 0.1 % NA SOLN
NASAL | Status: AC
  Filled 2017-01-11: qty 30

## 2017-01-11 MED ORDER — PROPOFOL 10 MG/ML IV BOLUS
INTRAVENOUS | Status: AC
  Filled 2017-01-11: qty 20

## 2017-01-11 MED ORDER — LIDOCAINE 2% (20 MG/ML) 5 ML SYRINGE
INTRAMUSCULAR | Status: AC
  Filled 2017-01-11: qty 5

## 2017-01-11 MED ORDER — POTASSIUM CHLORIDE 10 MEQ/100ML IV SOLN
10.0000 meq | INTRAVENOUS | Status: DC
  Administered 2017-01-11 (×2): 10 meq via INTRAVENOUS
  Filled 2017-01-11 (×2): qty 100

## 2017-01-11 MED ORDER — FENTANYL CITRATE (PF) 100 MCG/2ML IJ SOLN
INTRAMUSCULAR | Status: DC | PRN
  Administered 2017-01-11: 50 ug via INTRAVENOUS

## 2017-01-11 MED ORDER — FENTANYL CITRATE (PF) 250 MCG/5ML IJ SOLN
INTRAMUSCULAR | Status: AC
  Filled 2017-01-11: qty 5

## 2017-01-11 MED ORDER — PROPOFOL 10 MG/ML IV BOLUS
INTRAVENOUS | Status: DC | PRN
  Administered 2017-01-11: 100 mg via INTRAVENOUS

## 2017-01-11 MED ORDER — POTASSIUM CHLORIDE IN NACL 40-0.9 MEQ/L-% IV SOLN
INTRAVENOUS | Status: AC
  Administered 2017-01-11: 150 mL/h via INTRAVENOUS
  Filled 2017-01-11 (×3): qty 1000

## 2017-01-11 MED ORDER — DEXAMETHASONE SODIUM PHOSPHATE 10 MG/ML IJ SOLN
INTRAMUSCULAR | Status: DC | PRN
  Administered 2017-01-11: 10 mg via INTRAVENOUS

## 2017-01-11 MED ORDER — IOPAMIDOL (ISOVUE-300) INJECTION 61%
INTRAVENOUS | Status: AC
  Administered 2017-01-11: 75 mL
  Filled 2017-01-11: qty 75

## 2017-01-11 MED ORDER — LIDOCAINE HCL (PF) 1 % IJ SOLN
INTRAMUSCULAR | Status: AC
  Filled 2017-01-11: qty 30

## 2017-01-11 MED ORDER — MIDAZOLAM HCL 5 MG/5ML IJ SOLN
INTRAMUSCULAR | Status: DC | PRN
  Administered 2017-01-11: 1 mg via INTRAVENOUS

## 2017-01-11 MED ORDER — LIDOCAINE HCL 2 % IJ SOLN
INTRAMUSCULAR | Status: AC
  Filled 2017-01-11: qty 20

## 2017-01-11 MED ORDER — TRIAMCINOLONE ACETONIDE 40 MG/ML IJ SUSP
INTRAMUSCULAR | Status: AC
  Filled 2017-01-11: qty 5

## 2017-01-11 MED ORDER — SUCCINYLCHOLINE CHLORIDE 20 MG/ML IJ SOLN
INTRAMUSCULAR | Status: DC | PRN
  Administered 2017-01-11: 100 mg via INTRAVENOUS

## 2017-01-11 MED ORDER — FENTANYL CITRATE (PF) 100 MCG/2ML IJ SOLN: 25.0000 ug | INTRAMUSCULAR | Status: DC | PRN

## 2017-01-11 MED ORDER — OXYMETAZOLINE HCL 0.05 % NA SOLN
NASAL | Status: AC
Start: 2017-01-11 — End: 2017-01-11
  Filled 2017-01-11: qty 15

## 2017-01-11 MED ORDER — ONDANSETRON HCL 4 MG/2ML IJ SOLN
INTRAMUSCULAR | Status: DC | PRN
  Administered 2017-01-11: 4 mg via INTRAVENOUS

## 2017-01-11 SURGICAL SUPPLY — 54 items
BALL CTTN LRG ABS STRL LF (GAUZE/BANDAGES/DRESSINGS)
BALLN PULM 15 16.5 18 X 75CM (BALLOONS)
BALLN PULM 15 16.5 18X75 (BALLOONS)
BALLOON PULM 15 16.5 18X75 (BALLOONS) IMPLANT
BLADE SURG 15 STRL LF DISP TIS (BLADE) IMPLANT
BLADE SURG 15 STRL SS (BLADE)
BRUSH CYTOL CELLEBRITY 1.5X140 (MISCELLANEOUS) IMPLANT
CANISTER SUCT 1200ML W/VALVE (MISCELLANEOUS) ×3 IMPLANT
CANISTER SUCT 3000ML PPV (MISCELLANEOUS) ×3 IMPLANT
CONT SPEC 4OZ CLIKSEAL STRL BL (MISCELLANEOUS) ×2 IMPLANT
COTTONBALL LRG STERILE PKG (GAUZE/BANDAGES/DRESSINGS) IMPLANT
COVER BACK TABLE 60X90IN (DRAPES) ×3 IMPLANT
COVER MAYO STAND STRL (DRAPES) ×3 IMPLANT
COVER SURGICAL LIGHT HANDLE (MISCELLANEOUS) ×6 IMPLANT
CRADLE DONUT ADULT HEAD (MISCELLANEOUS) IMPLANT
DRAPE HALF SHEET 40X57 (DRAPES) ×3 IMPLANT
FORCEPS BIOP RJ4 1.8 (CUTTING FORCEPS) IMPLANT
GAUZE SPONGE 4X4 12PLY STRL (GAUZE/BANDAGES/DRESSINGS) ×3 IMPLANT
GLOVE ECLIPSE 8.0 STRL XLNG CF (GLOVE) ×3 IMPLANT
GOWN BRE IMP SLV AUR LG STRL (GOWN DISPOSABLE) IMPLANT
GOWN STRL REUS W/ TWL LRG LVL3 (GOWN DISPOSABLE) IMPLANT
GOWN STRL REUS W/ TWL XL LVL3 (GOWN DISPOSABLE) IMPLANT
GOWN STRL REUS W/TWL LRG LVL3 (GOWN DISPOSABLE)
GOWN STRL REUS W/TWL XL LVL3 (GOWN DISPOSABLE)
GUARD TEETH (MISCELLANEOUS) ×3 IMPLANT
KIT BASIN OR (CUSTOM PROCEDURE TRAY) ×3 IMPLANT
KIT ROOM TURNOVER OR (KITS) ×3 IMPLANT
MARKER SKIN DUAL TIP RULER LAB (MISCELLANEOUS) ×3 IMPLANT
NDL HYPO 18GX1.5 BLUNT FILL (NEEDLE) IMPLANT
NDL HYPO 25GX1X1/2 BEV (NEEDLE) IMPLANT
NEEDLE 22X1 1/2 (OR ONLY) (NEEDLE) IMPLANT
NEEDLE BIOPSY TRANSBRONCH 21G (NEEDLE) IMPLANT
NEEDLE HYPO 18GX1.5 BLUNT FILL (NEEDLE) IMPLANT
NEEDLE HYPO 25GX1X1/2 BEV (NEEDLE) IMPLANT
NS IRRIG 1000ML POUR BTL (IV SOLUTION) ×3 IMPLANT
OIL SILICONE PENTAX (PARTS (SERVICE/REPAIRS)) ×3 IMPLANT
PAD ARMBOARD 7.5X6 YLW CONV (MISCELLANEOUS) ×6 IMPLANT
PATTIES SURGICAL .5 X3 (DISPOSABLE) ×3 IMPLANT
SOLUTION ANTI FOG 6CC (MISCELLANEOUS) IMPLANT
SPONGE GAUZE 4X4 12PLY STER LF (GAUZE/BANDAGES/DRESSINGS) ×6 IMPLANT
SURGILUBE 2OZ TUBE FLIPTOP (MISCELLANEOUS) ×3 IMPLANT
SUT SILK 2 0 SH (SUTURE) ×3 IMPLANT
SYR 20ML ECCENTRIC (SYRINGE) ×3 IMPLANT
SYR 5ML LL (SYRINGE) IMPLANT
SYR 5ML LUER SLIP (SYRINGE) ×3 IMPLANT
SYR CONTROL 10ML LL (SYRINGE) IMPLANT
SYR INFLATE BILIARY GAUGE (MISCELLANEOUS) IMPLANT
TOWEL OR 17X24 6PK STRL BLUE (TOWEL DISPOSABLE) ×3 IMPLANT
TRAP SPECIMEN MUCOUS 40CC (MISCELLANEOUS) ×3 IMPLANT
TUBE CONNECTING 12'X1/4 (SUCTIONS) ×1
TUBE CONNECTING 12X1/4 (SUCTIONS) ×2 IMPLANT
TUBE CONNECTING 20'X1/4 (TUBING) ×1
TUBE CONNECTING 20X1/4 (TUBING) ×2 IMPLANT
WATER STERILE IRR 1000ML POUR (IV SOLUTION) ×3 IMPLANT

## 2017-01-11 NOTE — Progress Notes (Signed)
Patient seen at bedside this afternoon. I discussed the findings of his direct laryngoscopy and high suspicion for a cancerous mass. We are awaiting biopsy results. We discussed that he is high risk for aspiration with oral intake of liquids and solids and that we will try to provide nutrition either via an NG tube or PEG tube. We also discussed that we would like to obtain consultation from medical and radiation oncology for further evaluation and assistance. He seemed to understand and agree, however he has attempted to leave against medical advice to eat and smoke a cigarette, but has agreed to stay as he is unable to ambulate safely on his own. We will try to provide nutrition now via placement of a panda tube and provide a nicotine patch to reduce cigarette cravings.  Zada Finders, MD Internal Medicine PGY-2

## 2017-01-11 NOTE — Discharge Summary (Signed)
Name: Jermaine Scott MRN: [id number] DOB: 1950/12/15 66 y.o. PCP: Patient, No Pcp Per    <<<Patient Signed [place] Advice >>>  Date of Admission: 01/09/2017  1:24 PM Date of Discharge: 01/12/2017 Attending Physician: Annia Belt, MD  Discharge Diagnosis: 1. Oropharyngeal Mass Highly Suspicious for Cancer 2.Dehydration 3. AKI 4. Alcohol use disorder  Principal Problem:   Oropharyngeal mass Active Problems:   Syncope   COPD (chronic obstructive pulmonary disease) (HCC)   Hypotension   AKI (acute kidney injury) (HCC)   Dyspnea   Dysphagia   S/P primary angioplasty with coronary stent   History of acute anterior wall MI   Dehydration   Discharge Medications: Allergies as of 01/12/2017      Reactions   No Known Allergies          Durable Medical Equipment        Start     Ordered   01/11/17 1548  For home use only DME 3 n 1  Once     01/11/17 1547      Disposition and follow-up:   Jermaine Scott signed himself out [place] from [facility] in stable condition.  At the hospital follow up visit please address:  1.   Oropharyngeal Mass: High suspicion for cancer. If able and patient is agreeable, please follow up on Medical and Radiation Oncology referrals. He is high risk for aspiration as well as malnutrition/dehydration and will likely need PEG tube placement if he undergoes radiation and chemotherapy.  2.  Labs / imaging needed at time of follow-up: CT Chest w/ Contrast  3.  Pending labs/ test needing follow-up: Surgical Pathology (01/11/17) of posterior pharyngeal wall tumor  Follow-up Appointments: Follow-up Information    Josephina Gip, NP Follow up.   Specialty:  Infectious Diseases Why:  after you see [name] in [place] they will make you an apt with [name] information: [address] [place] [address] [place], [place], [place] Follow up on 01/18/2017.   Specialty:  Internal Medicine Why:  9:15 for hospital follow up, then next apt will be with [name] information: [address] [place] [address] [phone]        [facility] Follow up.   Why:  HHPT , HHOT Contact information: [place] [address] [place] Follow up.   Why:  3 n 1  Contact information: [place] [address] [phone]           Hospital Course by problem list: Principal Problem:   Oropharyngeal mass Active Problems:   Syncope   COPD (chronic obstructive pulmonary disease) (HCC)   Hypotension   AKI (acute kidney injury) ([place])   Dyspnea   Dysphagia   S/P primary angioplasty with coronary stent   History of acute anterior wall MI   Dehydration   Oropharyngeal Mass: Jermaine Scott is a 66 yo 50-pack year smoker with chronic alcohol use who initially presented for near-syncope, but had a choking spell requiring Heimlich Maneuver in the ED on a potassium supplement.  Later, he had an episode of severe dyspnea where he was coughing up clear white fluid.  Due to his underlying COPD, he was given duoneb treatment with minimal effect but also suspected  possible aspiration. CXR after episode was normal. Speech evaluation showed inability to swallow liquids with concern for pharyngeal obstruction.  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 CT scan of the neck confirmed an approximate 2 cm hypopharyngeal mass. Surrounding necrotic lymphadenopathy was described but no nodes larger than 1 cm. 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 He  appeared to understand and agree to the plans however signed himself out [place] on hospital day 4. Patient stated that he has a son in [place] but little local family support. I attempted calling an Emergency Contact number listed in EPIC Torell Minder [phone]) without response.  Dehydration He presented after a near-syncopal episode the morning of admission with only prodrome of mild HA.  He described orthostatic dizziness on presentation and stated he thought he may not have been drinking enough.  After receiving fluid with EMS, his bp in the ED was ~60/40.  He received 2L NS with improvement of dizziness and HA and some improvement in bp.  He underwent aggressive fluid resuscitation and was started on maintenance fluids given NPO status (see above) with improvement in blood pressure to the normal range. Other etiologies such as cardiac, autonomic neuropathy, and AI were considered, however history and clinical course were consistent with hypovolemia from significantly poor oral intake. ACTH stim test was normal.     AKI Serum Cr was 1.76 on day of admission with baseline around 0.8 in care everywhere 2017. This was believed to be a pre-renal injury with BUN/Cr of ~20 of admission.  Serum creatinine improved to 0.91 with IVF.  Alcohol use disorder: Patient stated he uses around 12 beers a week, but never had a history of withdrawal. He was put on CIWA protocol during his stay and had thiamine and folate supplementation.    Discharge Vitals:   BP 107/73 (BP Location: Left Arm)   Pulse (!) 59   Temp 97.8 F (36.6 C) (Oral)   Resp 17   Ht 5\' 10"  (1.778 m)   Wt 155 lb 14.4 oz (70.7 kg)   SpO2 100%   BMI 22.37 kg/m   Pertinent Labs, Studies, and Procedures:  CT SOFT TISSUE NECK W CONTRAST 01/11/17: IMPRESSION: Transspatial invasive LEFT palatine tonsil at least 2.3 x 2.3 cm ulcerative mass consistent with head and neck cancer extending to larynx concerning for tumoral  extension. Irregular RIGHT hypopharyngeal mucosa equivocal for tumor.  Cystic/necrotic bilateral neck lymph nodes highly concerning for metastatic (largest measuring 1 cm LEFT level IIa)  Multiple pulmonary nodules and small to moderate pleural effusions. Given above findings, these are concerning for metastasis and, dedicated CT chest is recommended.  Secretions within the trachea concerning for aspiration.  CT HEAD WO CONTRAST 01/09/17: IMPRESSION: 1.  No acute intracranial abnormality. 2.  Cerebral atrophy and small vessel ischemic change.  DG CHEST 2 VIEW 01/09/17: IMPRESSION: 1. No acute cardiopulmonary process. 2. Probable mild COPD.  Discharge Instructions: Patient will need arrangements for follow up with Medical Oncology, Radiation Oncology, and alternate route of nutrition if able and agreeable.   Signed: Zada Finders, MD 01/12/2017, 11:46 AM

## 2017-01-11 NOTE — Care Management Note (Addendum)
Case Management Note  Patient Details  Name: Jermaine Scott MRN: [id number] Date of Birth: 28-Aug-1950  Subjective/Objective:     From home alone, has   history of asthma, COPD, EtOH abuse admitted with syncope, fall, hypotension. Had an  chocking episode  On potassium pill in ED.   He states a care giver comes out to check his vitals but he is not sure who she is with.   6/21 [place], BSN - per pt/ot eval rec HHPT/HHOT, patient chose [facility] ,referral made to [facility] with [facility] , also for 3 n 1 .  Will need HH orders with face to face.  Patient has rolling walker and a shower bench at home already.  Patient states his PCP is Bethena Midget NP ,but he has not seen her just yet.   Patient has decided to leave AMA. NCM informed Butch Penny with [facility].  PCP- Bethena Midget  NP- he has an apt 6/28 at 9:15 to see Dan Europe first for hospital follow up in Transitional Care.               Action/Plan: NCM will follow for dc needs.   Expected Discharge Date:  01/12/17               Expected Discharge Plan:  [place]  In-House Referral:     Discharge planning Services  CM Consult  Post Acute Care Choice:  Durable Medical Equipment, Home Health Choice offered to:  Patient  DME Arranged:  3-N-1 DME Agency:  [place]:  PT, OT [facility] Agency:  [place]  Status of Service:  Completed, signed off  If discussed at [place] of Stay Meetings, dates discussed:    Additional Comments:  Zenon Mayo, RN 01/11/2017, 3:51 PM

## 2017-01-11 NOTE — Op Note (Signed)
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 Esophagoscopy. Bronchoscopy. Oral cavity exploration with multiple biopsies.   Surg:  Tyson Alias MD  Anes:  GOT  EBL:  Minimal  Comp:  None  Findings:  Erosion of the posterior pharyngeal wall from approximately halfway down in the nasopharynx to within 2 cm of the cricopharyngeus and from side to side with some bulk affect behind the posterior tonsillar pillars on both sides. Fixed to the prevertebral fascia. Normal larynx. Normal esophagus. No palpable adenopathy.  Procedure: With the patient in a comfortable supine position,   At an appropriate level, the table was turned 90 degrees away from Anesthesia.  A clean preparation and draping was performed in the standard fashion.  A surgical time out was obtained in the standard fashion.  A moist 4 x 4 was used to protect the upper gums.  Using the [facility] laryngoscope, the larynx was visualized.  The flexible bronchoscope was passed through the laryngoscope and inspection down into the mainstem bronchus on each side was performed with the findings as described above.  The bronchoscope and laryngoscope were removed.  The anterior commissure laryngoscope was introduced taking care to protect lips, teeth, and endotracheal tube.  Complete laryngoscopy was performed in the standard fashion.  The findings were as described above.  The laryngoscope was removed.  The cervical esophagoscope was lubricated and inserted into the hypopharynx.  With gentle pressure, it was passed through the cricopharyngeus and advanced to its full length without difficulty with the findings as described above.  It was removed.  The oropharynx, oral cavity, nasopharynx and hypopharynx were palpated with findings as described  above.  The neck was palpated on both sides with the findings as described above.  Taking care to protect the gums, teeth, and endotracheal tube, the McIvor mouth gag was introduced, expanded for visualization, and suspended from the Mayo stand.  The oropharynx was thoroughly visualized and also palpated under direct vision. Palate retractor was used to visualize up into the nasopharynx. Biopsies were taking at the superior extent of the tumor in the nasopharynx in the midline, behind the posterior tonsillar pillar on each side, and down into the hypopharynx in the midline. Hemostasis was spontaneous. Specimens were sent for permanent pathologic interpretation.   At this point the procedure was completed. The tooth guard was removed.   Dental status was intact.  The patient was returned to Anesthesia, awakened, extubated, and transferred to PACU in satisfactory condition.   Dispo:   PACU to 4 E.  Plan:  This is almost certainly cancer, T4 N0. A CT scan of the neck has been ordered. When we have a positive biopsy, he should get a PEG tube. I don't think GI will have any trouble traversing the cricopharyngeus. Also with a positive biopsy, we will get a PET scan.  This is a non-operable lesion. Will need to have him evaluated by medical oncology, and radiation oncology.  Tyson Alias MD

## 2017-01-11 NOTE — Transfer of Care (Signed)
Immediate Anesthesia Transfer of Care Note  Patient: Jermaine Scott  Procedure(s) Performed: Procedure(s): DIRECT LARYNGOSCOPY WITH BIOPSY  (N/A) ESOPHAGOSCOPY (N/A) FLEXIBLE BRONCHOSCOPY (N/A)  Patient Location: PACU  Anesthesia Type:General  Level of Consciousness: awake and alert   Airway & Oxygen Therapy: Patient Spontanous Breathing and Patient connected to nasal cannula oxygen  Post-op Assessment: Report given to RN and Post -op Vital signs reviewed and stable  Post vital signs: Reviewed and stable  Last Vitals:  Vitals:   01/11/17 0700 01/11/17 1051  BP: 114/74 (P) 115/85  Pulse: 71   Resp: 19 (P) 15  Temp: 36.4 C (P) 36.5 C    Last Pain:  Vitals:   01/11/17 0710  TempSrc:   PainSc: Asleep      Patients Stated Pain Goal: 0 (45/62/56 3893)  Complications: No apparent anesthesia complications

## 2017-01-11 NOTE — Anesthesia Preprocedure Evaluation (Addendum)
Anesthesia Evaluation  Patient identified by MRN, date of birth, ID band Patient awake    Reviewed: Allergy & Precautions, NPO status   Airway Mallampati: III   Neck ROM: Limited    Dental  (+) Edentulous Upper   Pulmonary shortness of breath, asthma , COPD, Current Smoker,    + rhonchi  + decreased breath sounds + stridor     Cardiovascular + CAD and + Cardiac Stents   Rhythm:Regular Rate:Normal     Neuro/Psych    GI/Hepatic   Endo/Other    Renal/GU Renal disease     Musculoskeletal   Abdominal   Peds  Hematology   Anesthesia Other Findings   Reproductive/Obstetrics                            Anesthesia Physical Anesthesia Plan  ASA: IV  Anesthesia Plan: General   Post-op Pain Management:    Induction: Intravenous  PONV Risk Score and Plan: 3 and Ondansetron, Dexamethasone, Propofol and Midazolam  Airway Management Planned: Oral ETT  Additional Equipment:   Intra-op Plan:   Post-operative Plan: Extubation in OR and Possible Post-op intubation/ventilation  Informed Consent: I have reviewed the patients History and Physical, chart, labs and discussed the procedure including the risks, benefits and alternatives for the proposed anesthesia with the patient or authorized representative who has indicated his/her understanding and acceptance.     Plan Discussed with: CRNA  Anesthesia Plan Comments: (Hi risk from pulm standpoint for [place])        Anesthesia Quick Evaluation

## 2017-01-11 NOTE — Anesthesia Postprocedure Evaluation (Signed)
Anesthesia Post Note  Patient: Jermaine Scott  Procedure(s) Performed: Procedure(s) (LRB): DIRECT LARYNGOSCOPY WITH BIOPSY  (N/A) ESOPHAGOSCOPY (N/A) FLEXIBLE BRONCHOSCOPY (N/A)     Patient location during evaluation: PACU Anesthesia Type: General Level of consciousness: awake and alert Pain management: pain level controlled Vital Signs Assessment: post-procedure vital signs reviewed and stable Respiratory status: spontaneous breathing, nonlabored ventilation, respiratory function stable and patient connected to nasal cannula oxygen Cardiovascular status: blood pressure returned to baseline and stable Postop Assessment: no signs of nausea or vomiting Anesthetic complications: no    Last Vitals:  Vitals:   01/11/17 1110 01/11/17 1123  BP: 121/73 128/78  Pulse: 60 (!) 50  Resp: 18 14  Temp: 36.7 C 36.3 C    Last Pain:  Vitals:   01/11/17 1123  TempSrc: Oral  PainSc:                  Brownie Gockel,JAMES TERRILL

## 2017-01-11 NOTE — Plan of Care (Signed)
Problem: Safety: Goal: Ability to remain free from injury will improve Outcome: Progressing Pt states pain goal is being met with meds

## 2017-01-11 NOTE — Progress Notes (Signed)
Subjective:  Jermaine Scott says he is feeling better today without any shortness of breath, light-headedness or dizziness.  Talking to him last night he now says that he has had increased difficulty swallowing for the last few months and has switched to eating softer foods.  He also says that over the last 6 months, he has lost around 40 lbs.    Objective:  Vital signs in last 24 hours: Vitals:   01/10/17 2302 01/11/17 0237 01/11/17 0605 01/11/17 0700  BP: (!) 149/99 119/70  114/74  Pulse: 69 65  71  Resp: 18 18  19   Temp: 97.8 F (36.6 C) 97.7 F (36.5 C)  97.5 F (36.4 C)  TempSrc: Oral Oral  Oral  SpO2: 100% 98%  97%  Weight:   70.7 kg (155 lb 14.4 oz)   Height:       Weight change: 2.676 kg (5 lb 14.4 oz)  Intake/Output Summary (Last 24 hours) at 01/11/17 0932 Last data filed at 01/11/17 0600  Gross per 24 hour  Intake             3900 ml  Output              300 ml  Net             3600 ml   Gen: Well-appearing in NAD.  Sitting up comfortably in bed.  Neuro:  Alert and oriented x3.   HEENT: NCAT. EOMI. Moist mucous membranes. No thrush. No cervical LAD.  No visible mass.  CV: RRR with no murmurs, rubs, or gallops. Pulm:  CTAB, no wheezes or rhonchi. Abdomen: Soft, non-tender to palpation in four quadrants.  Normal BS appreciated.  Extremities: No skin lesions appreciated.  No LE edema.    Assessment/Plan:  Principal Problem:   Syncope Active Problems:   COPD (chronic obstructive pulmonary disease) (HCC)   Hypotension   AKI (acute kidney injury) ([place])   Oral candidiasis   Dyspnea   Oropharyngeal dysphagia   S/P primary angioplasty with coronary stent   History of acute anterior wall MI   Dehydration  Oropharyngeal Dysphagia: Concern for possible obstructive mass of posterior pharynx.  Speech therapy noted that he was not able to tolerate even clear liquids without choking or spitting up with concern for pharyngeal obstruction.  He does have risk factors for  malignancy including 50 py smoking history, chronic alcohol use, male >60, and also has a history of ~40 lb weight loss.  However, weight loss could also be 2/2 inability to eat.  He also says his throat has been sore recently, now stating for the last few weeks.   -ENT consulted and will evaluate today with laryngoscope and biopsy if necessary - CT neck with contrast now given resolution of AKI - NPO with aspiration risk  - IV fluconazole 100 mg   Hypotension and Near syncope: This now appears to be secondary to severe dehydration and possible malnutrition. Syncope workup to date has been negative including negative carotid dopplers yesterday.  Given possibility of cardiac disease such as alcoholic cardiomyopathy and his history of cardiac disease, we will continue evaluation with an ECHO.  - continue NS at 150 ml/hr w/ 40 mEq K given NPO - ECHO - nutrition consult given appears malnourished - orthostatic bps - PT / OT evals  Dyspnea: No more dyspnea yesterday. The event in the ED was likely related to an attempt to eat and drink as he was coughing up fluids during the episode.  His underlying COPD may be contributing. - duoneb q6 PRN for shortness of breath - flutter valve PRN   AKI:  Improved with IVF. Serum creatinine has improved to 0.91 today from 1.76 day of admission.  His with baseline appears to be around 0.8 in 2017. - continue NS at 150 ml/hr  - will recheck in BMP AM  Alcohol use disorder: Patient states he uses around 12 beers a week and does not have a history of withdrawal.  - CIWA protocol  - continue thiamine and folate supplementation  Hypokalemia: K 3.4 today.  Repleting with 40 mEq K with IVF   LOS: 2 days   Jermaine Scott, Medical Student 01/11/2017, 9:32 AM  Attestation for Student Documentation:  I personally was present and performed or re-performed the history, physical exam and medical decision-making activities of this service and have verified  that the service and findings are accurately documented in the student's note.  Zada Finders, Jermaine Scott 01/11/2017, 10:06 AM

## 2017-01-11 NOTE — Evaluation (Signed)
Occupational Therapy Evaluation Patient Details Name: Jermaine Scott MRN: [id number] DOB: 1951/07/14 Today's Date: 01/11/2017    History of Present Illness Pt is a 66 yo male admitted 01/09/17 after bout of syncope resulting in mechanical fall. pt hypotensive and malnurished. PMH significant for asthm, COPD and alcohol abuse.    Clinical Impression   Pt admitted with above. He demonstrates the below listed deficits and will benefit from continued OT to maximize safety and independence with BADLs.  Pt presents to OT with generalized weakness, decreased activity tolerance, impaired balance, cognition very difficult to assess due to pt's fixation on eating.  He currently demonstrates poor activity tolerance and requires min A for ADLs.   His discharge disposition will be heavily dependent upon definitive diagnosis and pt treatment options.  He lives alone and will likely require more support at discharge.  I doubt seriously, he would be able to undergo chemo and/or radiation without external support/assist.  He may end up requiring SNF, if he doesn't have anyone who can assist him.        Follow Up Recommendations  Supervision/Assistance - 24 hour (dependent up definitive diagnosis and treatment ) .  See comments above - he may require SNF.    Equipment Recommendations  3 in 1 bedside commode;Tub/shower bench    Recommendations for Other Services       Precautions / Restrictions Precautions Precautions: Fall      Mobility Bed Mobility Overal bed mobility: Needs Assistance Bed Mobility: Sit to Supine;Supine to Sit     Supine to sit: Supervision Sit to supine: Supervision   General bed mobility comments: requires increased time   Transfers Overall transfer level: Needs assistance Equipment used: Rolling walker (2 wheeled) Transfers: Sit to/from [facility] Sit to Stand: Min assist Stand pivot transfers: Min assist       General transfer comment: Min A for  balance.  LOB posteriorly noted     Balance Overall balance assessment: Needs assistance Sitting-balance support: Feet supported Sitting balance-Leahy Scale: Good     Standing balance support: Bilateral upper extremity supported Standing balance-Leahy Scale: Poor Standing balance comment: min A and bil. UE support                            ADL either performed or assessed with clinical judgement   ADL Overall ADL's : Needs assistance/impaired Eating/Feeding: NPO Eating/Feeding Details (indicate cue type and reason): Pt demonstrates physical ability to self feed.  Pt is allowed small amounts of water, per RN  Grooming: Wash/dry hands;Wash/dry face;Oral care;Brushing hair;Set up;Sitting;Bed level Grooming Details (indicate cue type and reason): min A standing  Upper Body Bathing: Set up;Sitting   Lower Body Bathing: Minimal assistance;Sit to/from stand Lower Body Bathing Details (indicate cue type and reason): LOB posteriorly  Upper Body Dressing : Set up;Sitting   Lower Body Dressing: Minimal assistance;Sit to/from stand   Toilet Transfer: Minimal assistance;Ambulation;Comfort height toilet;RW;Grab bars Toilet Transfer Details (indicate cue type and reason): Pt with LOB posteriorly  Toileting- Clothing Manipulation and Hygiene: Minimal assistance;Sit to/from stand       Functional mobility during ADLs: Minimal assistance;Rolling walker General ADL Comments: Pt requires assist for balance      Vision         Perception     Praxis      Pertinent Vitals/Pain Pain Assessment: Faces Faces Pain Scale: No hurt     Hand Dominance Right   Extremity/Trunk Assessment Upper  Extremity Assessment Upper Extremity Assessment: Generalized weakness   Lower Extremity Assessment Lower Extremity Assessment: Defer to PT evaluation   Cervical / Trunk Assessment Cervical / Trunk Assessment: Kyphotic   Communication Communication Communication: No difficulties    Cognition Arousal/Alertness: Awake/alert Behavior During Therapy: Impulsive (irritable ) Overall Cognitive Status: No family/caregiver present to determine baseline cognitive functioning                                 General Comments: Pt fixated on oral intake and asks same questions repeatedly even after explanation provided    General Comments  VSS    Exercises     Shoulder Instructions      Home Living Family/patient expects to be discharged to:: Private residence Living Arrangements: Alone   Type of Home: Apartment Home Access: Stairs to enter [employer] of Steps: 3 Entrance Stairs-Rails: Can reach both Home Layout: One level     Bathroom Shower/Tub: Teacher, early years/pre: Standard     Home Equipment: Cane - single point          Prior Functioning/Environment Level of Independence: Independent with assistive device(s)        Comments: Pt insists he was managing well on his own         OT Problem List: Decreased strength;Decreased activity tolerance;Impaired balance (sitting and/or standing);Decreased cognition;Decreased safety awareness;Decreased knowledge of use of DME or AE      OT Treatment/Interventions: Self-care/ADL training;DME and/or AE instruction;Therapeutic activities;Cognitive remediation/compensation;Patient/family education;Balance training    OT Goals(Current goals can be found in the care plan section) Acute Rehab OT Goals Patient Stated Goal: to eat  OT Goal Formulation: With patient Time For Goal Achievement: 01/25/17 Potential to Achieve Goals: Good ADL Goals Pt Will Perform Upper Body Bathing: with modified independence;sitting Pt Will Perform Lower Body Bathing: with modified independence;sit to/from stand Pt Will Perform Upper Body Dressing: with modified independence;sitting Pt Will Perform Lower Body Dressing: with modified independence;sit to/from stand Pt Will Transfer to Toilet: with  modified independence;ambulating;regular height toilet;bedside commode;grab bars Pt Will Perform Toileting - Clothing Manipulation and hygiene: with modified independence;sit to/from stand Pt Will Perform Tub/Shower Transfer: Tub transfer;with supervision;ambulating;tub bench  OT Frequency: Min 2X/week   Barriers to D/C: Decreased caregiver support          Co-evaluation              AM-PAC PT "6 Clicks" Daily Activity     Outcome Measure Help from another person eating meals?: None Help from another person taking care of personal grooming?: A Little Help from another person toileting, which includes using toliet, bedpan, or urinal?: A Little Help from another person bathing (including washing, rinsing, drying)?: A Little Help from another person to put on and taking off regular upper body clothing?: A Little Help from another person to put on and taking off regular lower body clothing?: A Little 6 Click Score: 19   End of Session Equipment Utilized During Treatment: Rolling walker Nurse Communication: Mobility status  Activity Tolerance: Patient limited by fatigue Patient left: in bed;with call bell/phone within reach;with bed alarm set  OT Visit Diagnosis: Unsteadiness on feet (R26.81)                Time: 6712-4580 OT Time Calculation (min): 18 min Charges:  OT General Charges $OT Visit: 1 Procedure OT Evaluation $OT Eval Low Complexity: 1 Procedure G-Codes:  Abrina Petz Hepburn, OTR/L [phone]   Lucille Passy M 01/11/2017, 1:57 PM

## 2017-01-11 NOTE — Progress Notes (Signed)
Patient insisting on leaving against medical advise. RN informed patient of the risks if he leaves AMA. Posey Pronto, MD notified of patient desire to leave AMA. MD talked to patient at bedside about the risks. At the time, patient agreed to staying. Approximately 10 minutes after MD left bedside patient stated that since he couldn't eat or go outside to smoke a cigarette, he was going to leave. Patient had already gotten himself dressed and removed one of his IVs. RN again went over the risks of patient leaving AMA. Patient said he understood and signed Dawn paperwork. RN removed remaining IV without complication. Patient got to the hallway and was very weak. Staff talked to patient about whether or not he was going to be able to even make it home. After discussing with staff, patient decided that it was not safe enough for him to go home and stated that he would stay. Staff shredded AMA paperwork and assisted patient back to bed.   Basil Dess, RN

## 2017-01-12 ENCOUNTER — Ambulatory Visit: Payer: Medicare Other | Attending: Radiation Oncology | Admitting: Radiation Oncology

## 2017-01-12 ENCOUNTER — Encounter (HOSPITAL_COMMUNITY): Payer: Self-pay | Admitting: Otolaryngology

## 2017-01-12 DIAGNOSIS — R1312 Dysphagia, oropharyngeal phase: Secondary | ICD-10-CM | POA: Insufficient documentation

## 2017-01-12 DIAGNOSIS — F1019 Alcohol abuse with unspecified alcohol-induced disorder: Secondary | ICD-10-CM | POA: Insufficient documentation

## 2017-01-12 DIAGNOSIS — R221 Localized swelling, mass and lump, neck: Secondary | ICD-10-CM | POA: Insufficient documentation

## 2017-01-12 DIAGNOSIS — I959 Hypotension, unspecified: Secondary | ICD-10-CM | POA: Insufficient documentation

## 2017-01-12 DIAGNOSIS — I889 Nonspecific lymphadenitis, unspecified: Secondary | ICD-10-CM

## 2017-01-12 LAB — GLUCOSE, CAPILLARY: GLUCOSE-CAPILLARY: 87 mg/dL (ref 65–99)

## 2017-01-12 MED ORDER — POTASSIUM CHLORIDE IN NACL 40-0.9 MEQ/L-% IV SOLN
INTRAVENOUS | Status: DC
  Filled 2017-01-12: qty 1000

## 2017-01-12 NOTE — Progress Notes (Signed)
Patient has signed AMA paperwork but before he signed the paperwork he pulled out his IV and disconnected himself from the monitors. Notified MD.

## 2017-01-12 NOTE — Care Management (Signed)
CM informed [facility] that pt did not leave AMA and will need HH and DME as previously arranged Elenor Quinones, RN, BSN [phone]

## 2017-01-12 NOTE — Progress Notes (Addendum)
Subjective: Jermaine Scott was seen during rounds this morning. He was sitting up eating tomato soup. He says he is feeling better. 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 We discussed that we are planning arrangements for follow up with Medical and Radiation Oncology for further management as well as obtaining a CT scan of his chest while admitted. We also discussed the side effects of potential chemotherapy and radiation. We discussed that he will need an alternative route of adequate nutrition and he states he prefers a PEG tube rather than nasogastric feeding. We also discussed that he will need extra assistance with the planned interventions and advised that he go to a skilled nursing facility for this. He seemed to understand and agree with the plan, although he has subsequently asked our nursing staff what the plan is. He has attempted to leave against medical advice, however he has been too weak to walk out of the hospital on his own.  Objective:  Vital signs in last 24 hours: Vitals:   01/11/17 2300 01/11/17 2350 01/12/17 0300 01/12/17 0729  BP: 114/85  135/84 107/73  Pulse: 82  66 (!) 59  Resp: 13  16 17   Temp:  98 F (36.7 C) 97.9 F (36.6 C) 97.8 F (36.6 C)  TempSrc:  Oral Oral Oral  SpO2: 96%  97% 100%  Weight:      Height:       Weight change:   Intake/Output Summary (Last 24 hours) at 01/12/17 1053 Last data filed at 01/12/17 0759  Gross per 24 hour  Intake             1670 ml  Output              900 ml  Net              770 ml   Gen: Sitting up comfortably in bed.  Neuro:  Alert and oriented x3.    HEENT: NCAT. EOMI. Moist mucous membranes. No thrush. CV: RRR with no murmurs, rubs, or gallops. Pulm:  CTAB, no wheezes or rhonchi. Extremities: No LE edema.    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 This was also seen on CT Neck. Surgical pathology is pending. As above, we will proceed with consultation to Medical and Radiation Oncology as well as Gastroenterology for potential PEG tube placement and Dental for extraction of teet. Patient has insisted on some form of oral intake while here and we have provided a liquid diet for now. He understands his high risk for aspiration.  - Greatly appreciate ENT assistance - Clear liquids for now; will need to be NPO prior to potential PEG placement - Obtain CT Chest w/ contrast - Continue NS @ 125 mL/hr + KCl 40 mEq - Consulted Medical Oncology and Radiation Oncology - Consult Dental - Continue PT/OT - Consult to CSW for SNF placement - f/u surgical pathology  Hypotentsion/AKI:  Secondary to severe malnutrition from poor oral intake due to oropharyngeal mass. Resolved with fluid resuscitation.  Continuing IVF with poor oral intake and for adequate hydration prior to contrast imaging studies. - Continue IVF as above - Monitor BMET  Alcohol use disorder: Patient states he uses around 12 beers a week and does not have a history of withdrawal.  - CIWA protocol  - continue thiamine and folate supplementation    LOS: 3 days   Zada Finders, MD 01/12/2017, 10:53 AM

## 2017-01-12 NOTE — Progress Notes (Signed)
Medicine attending: I examined this patient today and I concur with the evaluation and management plan which will be subsequently recorded in the daily progress note by resident physician Dr. Zada Finders. Surgeon's report now available.  Submucosal mass in the area of the left tonsil in the hypopharyngeal region with likely extension to the right.  Biopsies taken.  Results pending. CT scan of the soft tissues of the neck which I personally reviewed confirms an approximate 2 cm hypopharyngeal mass.  Surrounding necrotic lymphadenopathy but no nodes larger than 1 cm.  Suspicion on cuts through the upper lung that there may be pulmonary metastases. 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  This is not a surgically resectable lesion.  He will need radiation and chemotherapy. We will complete inpatient evaluation with a CT scan of the chest.  PET scan as an outpatient. Oncologic dental evaluation.  He will likely need multiple teeth extracted in anticipation of radiation. GI consultation re-PEG tube to support nutrition through treatments. Care management/social work consult.  He has agreed to short-term skilled nursing facility placement which will be necessary for him to get through what is expected to be a approximate 6 week course of radiation and chemotherapy with daily trips to the hospital Monday through Fridays. He does have a son in [place].  He does not have his phone number on hand.  We would like to advise his son of the situation so he can provide some support.  Patient agrees.

## 2017-01-15 ENCOUNTER — Telehealth: Payer: Self-pay | Admitting: *Deleted

## 2017-01-15 NOTE — Telephone Encounter (Signed)
Oncology Nurse Navigator Documentation  Placed introductory call to new referral patient.  Contact # for [employer].  LVMM for Jermaine Scott, [employer] residence, requesting return call.  Unable to leave message for Emergency Contact.  Gayleen Orem, RN, BSN, [place] [facility] Nurse [place] at [place] [phone]

## 2017-01-17 ENCOUNTER — Telehealth: Payer: Self-pay | Admitting: *Deleted

## 2017-01-17 NOTE — Telephone Encounter (Signed)
Oncology Nurse Navigator Documentation  Tried to reach patient via emergency contact.  No answer, unable to leave message.  Gayleen Orem, RN, BSN, [place] [facility] Nurse [place] at [place] [phone]

## 2017-01-17 NOTE — Telephone Encounter (Signed)
Oncology Nurse Navigator Documentation  Spoke with Burnice Logan, [facility].  She indicated Mr. Caligiuri last resided at [place] in 2007, she has no record of his current phone number.  Gayleen Orem, RN, BSN, [place] [facility] Nurse [place] at [place] [phone]

## 2017-03-24 DEATH — deceased

## 2018-03-04 ENCOUNTER — Emergency Department (HOSPITAL_COMMUNITY)
Admission: EM | Admit: 2018-03-04 | Discharge: 2018-03-04 | Disposition: A | Discharge status: Home / Self Care | Payer: Medicare Other

## 2019-05-09 IMAGING — CT CT NECK W/ CM
3 of 4 series · 13 of 33 positions shown, 16 images · IV contrast (iopamidol)
Comparison: CT HEAD January 09, 2017 and CT chest July 18, 2013

CLINICAL DATA: Choking episode while attempting to swallow pill in
the emergency department. Status post endoscopy demonstrating head
and neck cancer. History of COPD.

EXAM:
CT NECK WITH CONTRAST
TECHNIQUE: Multidetector CT imaging of the neck was performed using the
standard protocol following the bolus administration of intravenous
contrast.
CONTRAST:  75mL 2UOI2D-PCC IOPAMIDOL (2UOI2D-PCC) INJECTION 61%

[Series 3: neck 2.0 i31s 3 · axial · 0.55mm/px · z∈[-344,-176]mm · 5 of 128 slices shown, 7 images]
[im 22/128  soft-tissue]
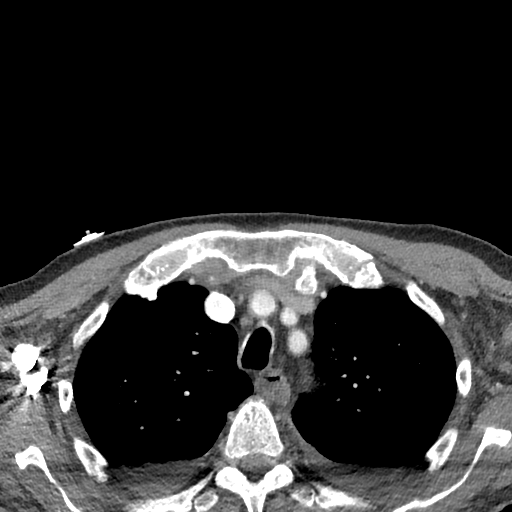
[im 22/128  bone]
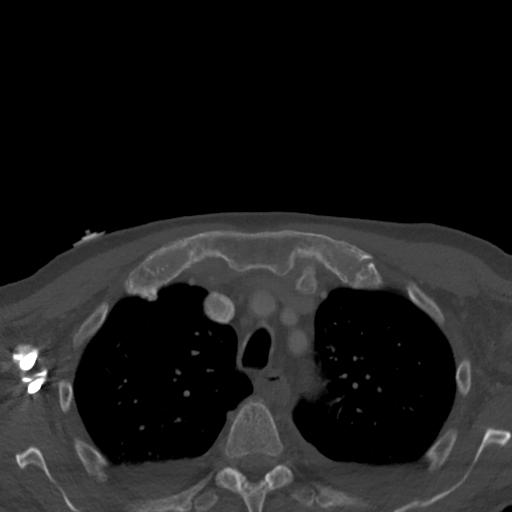
[im 43/128  bone]
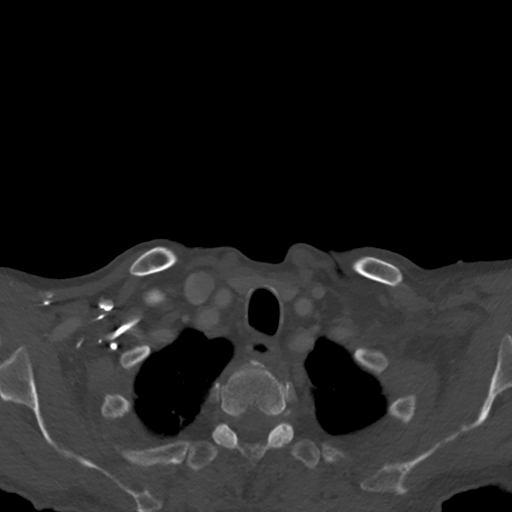
[im 64/128  bone]
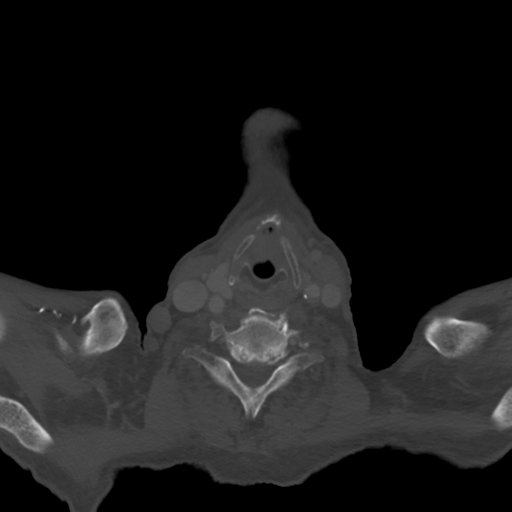
[im 85/128  bone]
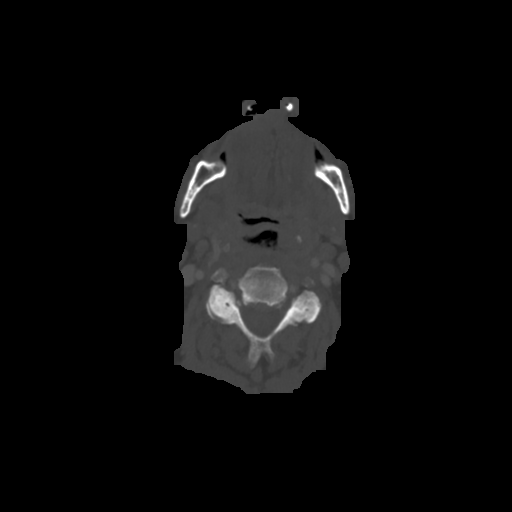
[im 106/128  soft-tissue]
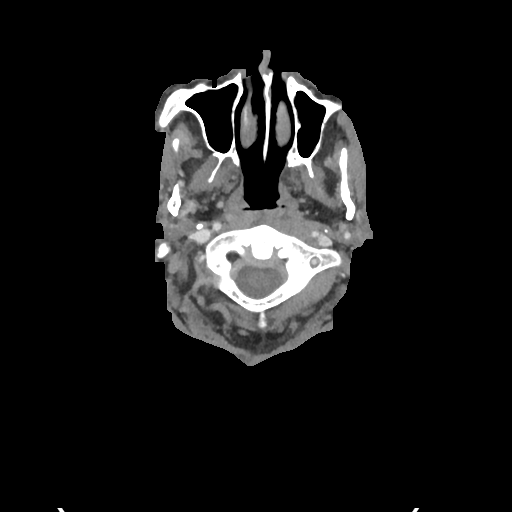
[im 106/128  bone]
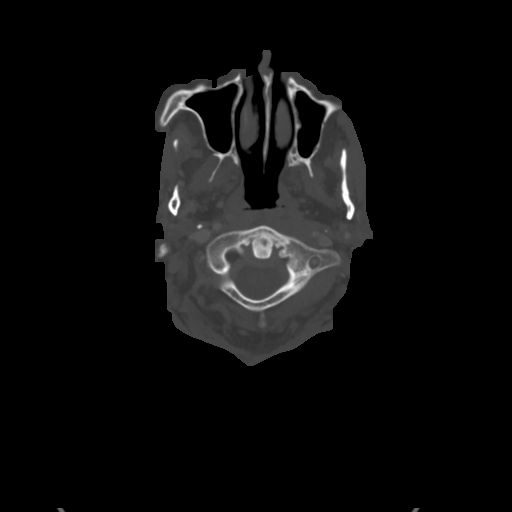

[Series 6: coronal st · coronal · 0.52mm/px · 3 of 150 slices shown]
[im 30/150  bone]
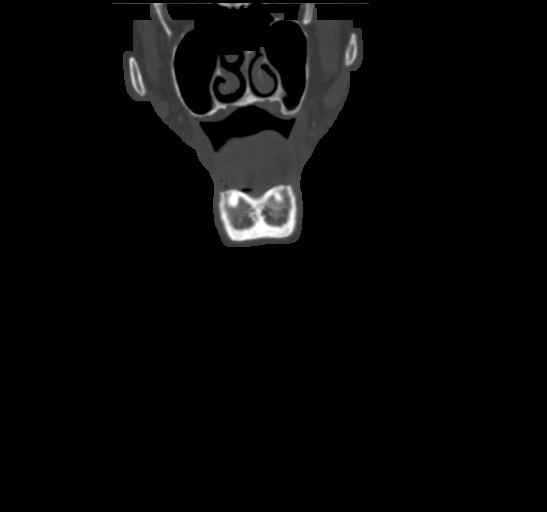
[im 60/150  bone]
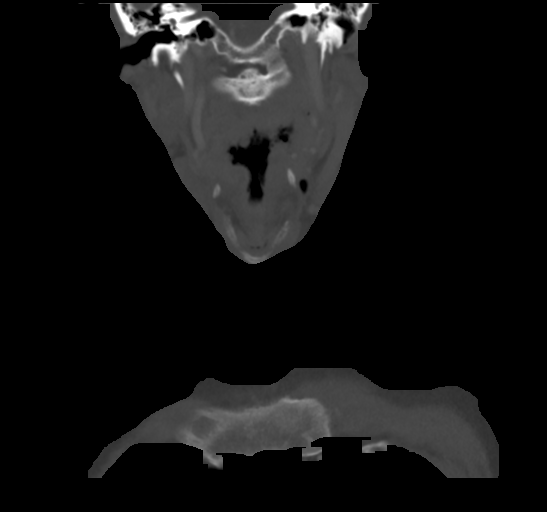
[im 90/150  bone]
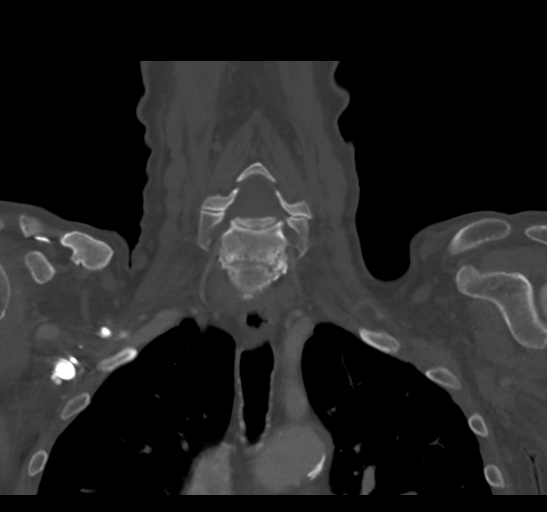

[Series 7: sagittal st · sagittal · 0.52mm/px · 5 of 101 slices shown, 6 images]
[im 34/101  bone]
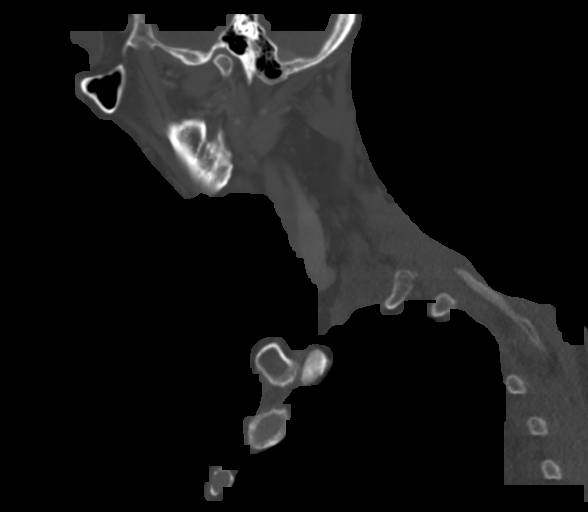
[im 42/101  bone]
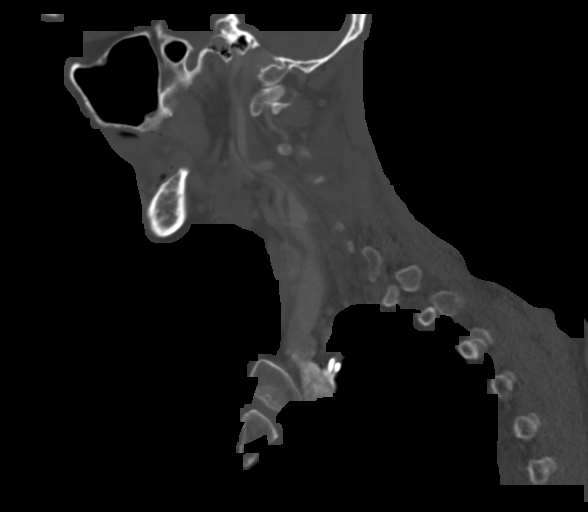
[im 51/101  soft-tissue]
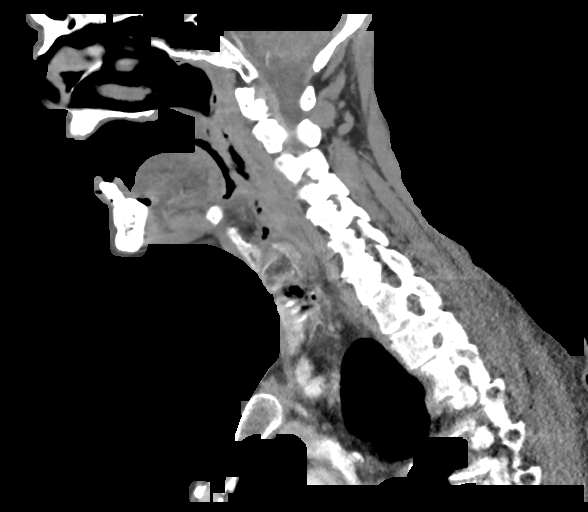
[im 51/101  bone]
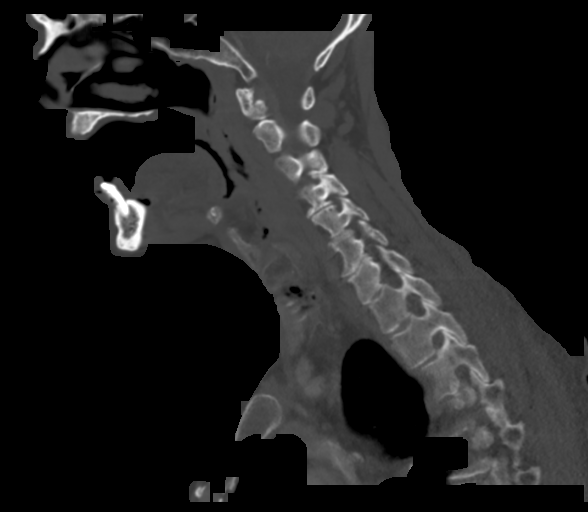
[im 59/101  bone]
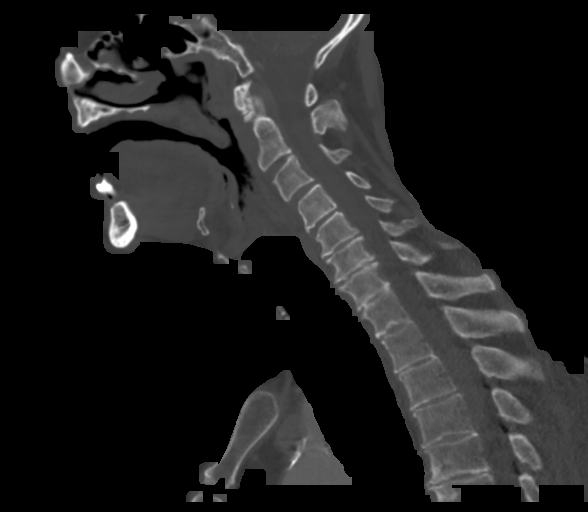
[im 67/101  bone]
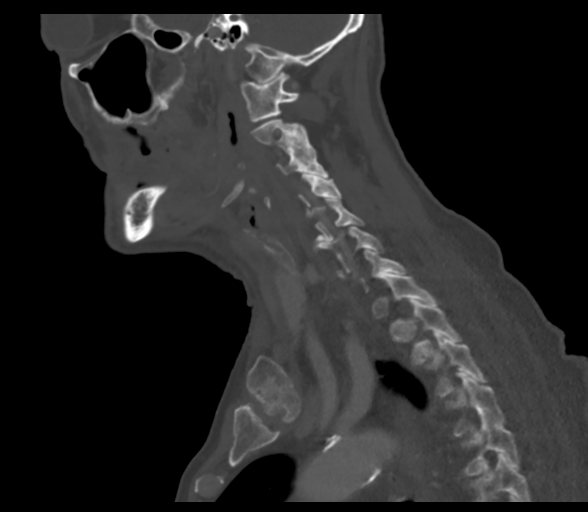

[13 of 33 positions shown; findings below may reference images not displayed]

FINDINGS: PHARYNX AND LARYNX: 2.3 x 2.3 cm LEFT palatine tonsil paranoid
ulcerative mucosal to submucosal mass extending to LEFT carotid
space, LEFT retropharyngeal space, LEFT prevertebral space. Effaced
bilateral piriform sinuses. Thickened LEFT greater than RIGHT
glossopharyngeal old and narrowed vallecula. Secretions in the
hypopharynx. Irregular RIGHT pharyngeal soft tissues. Apposition of
the true vocal cords with irregular appearance. Trace prevertebral
effusion.

SALIVARY GLANDS: Punctate RIGHT submandibular sialolith without
sialoadenitis.

THYROID: Normal.

LYMPH NODES: 6 mm central hypodense LEFT level IIa lymph node,
hypodense 10 mm bilateral level IIa lymph nodes.

VASCULAR: Moderate calcific atherosclerosis. Lateral displacement of
the LEFT internal carotid artery with mild narrowing due to LEFT
pharyngeal mass.

LIMITED INTRACRANIAL: Normal.

VISUALIZED ORBITS: Status post bilateral ocular lens implants.

MASTOIDS AND VISUALIZED PARANASAL SINUSES: Mild paranasal sinus
mucosal thickening. Imaged mastoid air cells are well aerated.

SKELETON: Nonacute. Intact laryngeal cartilage. Old RIGHT nasal bone
and osseous nasal septum fracture. Moderate to severe degenerative
change of the cervical spine. Patient is edentulous.

UPPER CHEST: Secretions in the trachea concerning for aspiration.
Small to moderate narrowing pleural effusions. 3 mm sub solid LEFT
upper lobe pulmonary nodule, 5 mm RIGHT upper lobe pulmonary nodule
image 103/120, 8 mm ground-glass pulmonary nodule RIGHT upper lobe
axial 110/120. 4 mm sub solid pulmonary nodule LEFT upper lobe.
Apical bullous changes.

OTHER: None.
IMPRESSION: Transspatial invasive LEFT palatine tonsil at least 2.3 x 2.3 cm
ulcerative mass consistent with head and neck cancer extending to
larynx concerning for tumoral extension. Irregular RIGHT
hypopharyngeal mucosa equivocal for tumor.

Cystic/necrotic bilateral neck lymph nodes highly concerning for
metastatic (largest measuring 1 cm LEFT level IIa)

Multiple pulmonary nodules and small to moderate pleural effusions.
Given above findings, these are concerning for metastasis and,
dedicated CT chest is recommended.

Secretions within the trachea concerning for aspiration.
